# Patient Record
Sex: Female | Born: 1945 | Hispanic: No
Health system: Midwestern US, Academic
[De-identification: ages and names within clinical notes are randomized; demographics above are authoritative.]

---

## 2017-03-26 IMAGING — MG MAMMOGRAM, DIGITAL SCREEN BILA
1 series · 6 of 6 positions shown · non-contrast
Comparison: none

[Series 2: R CC · right · 6 of 6 slices shown]
[im 1/6]
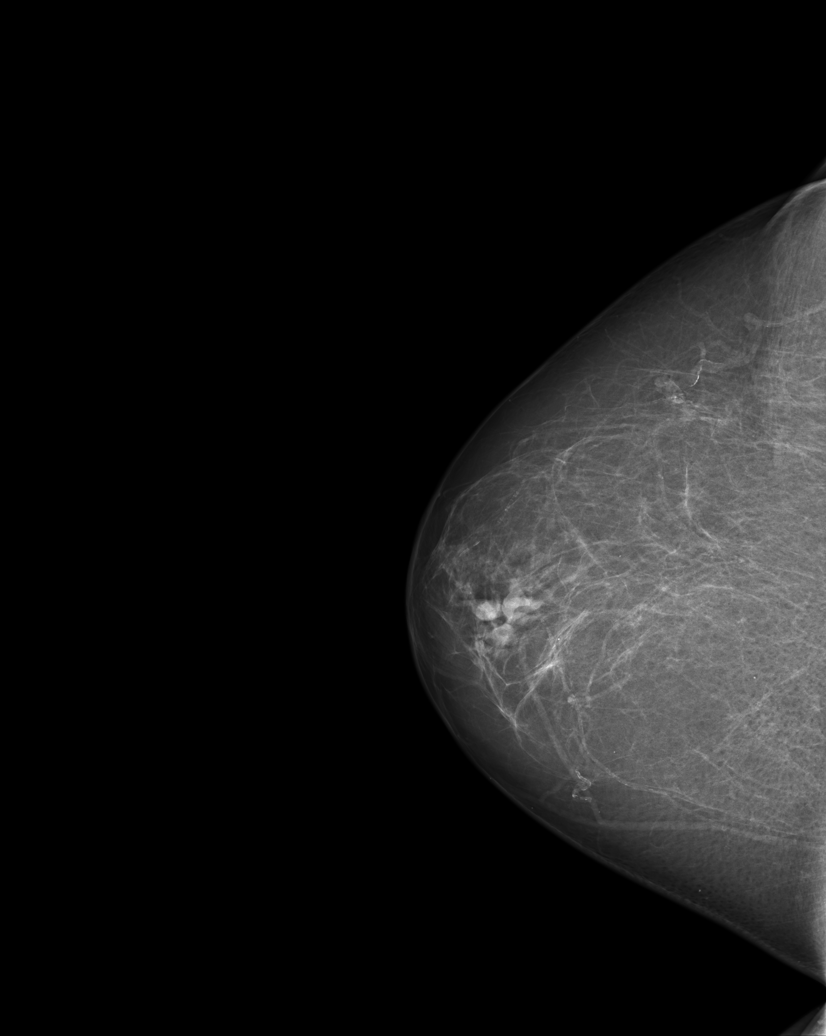
[im 2/6]
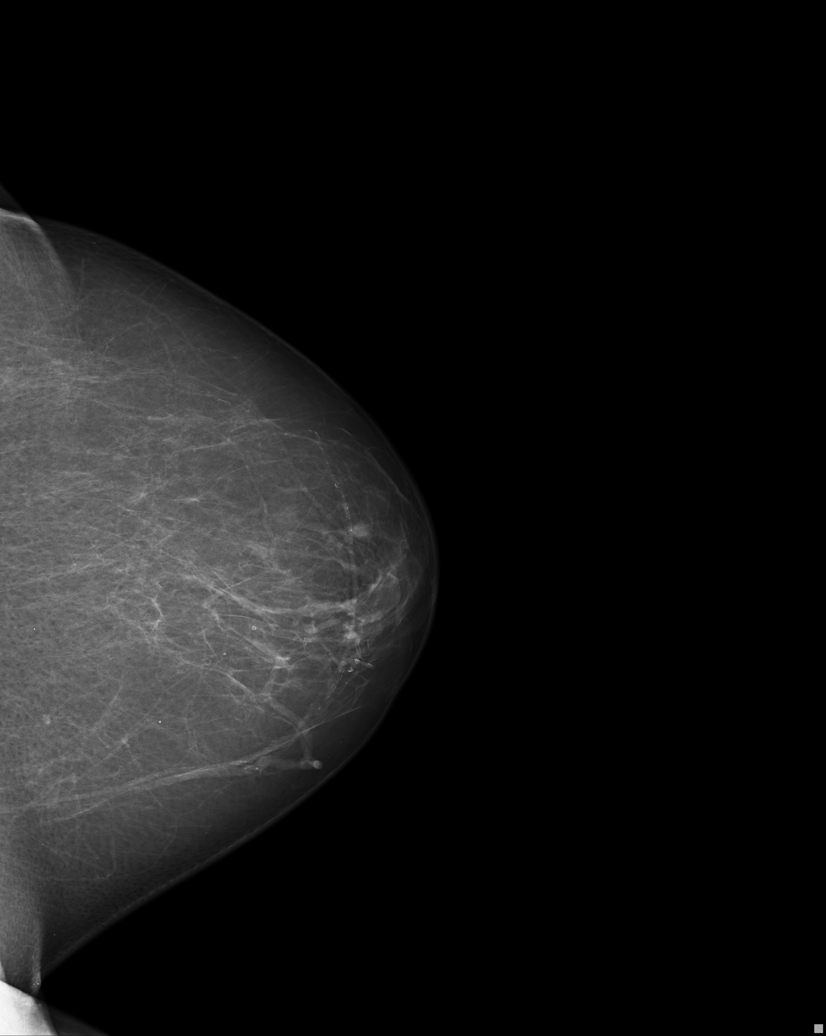
[im 3/6]
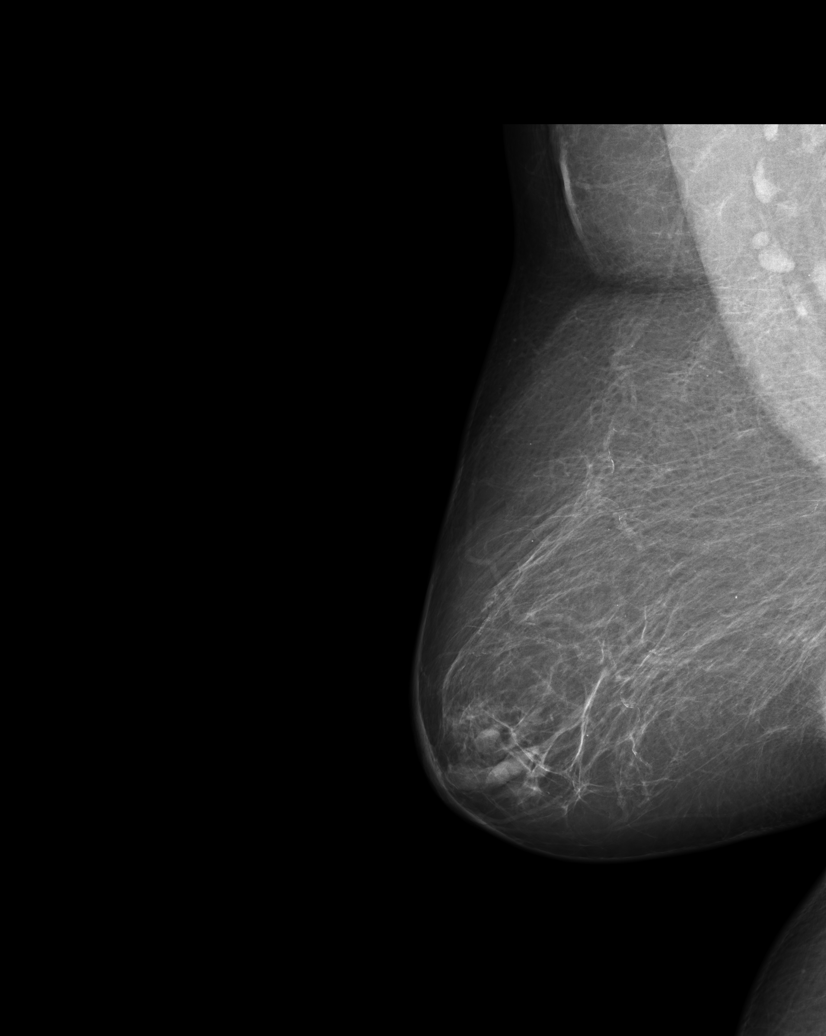
[im 4/6]
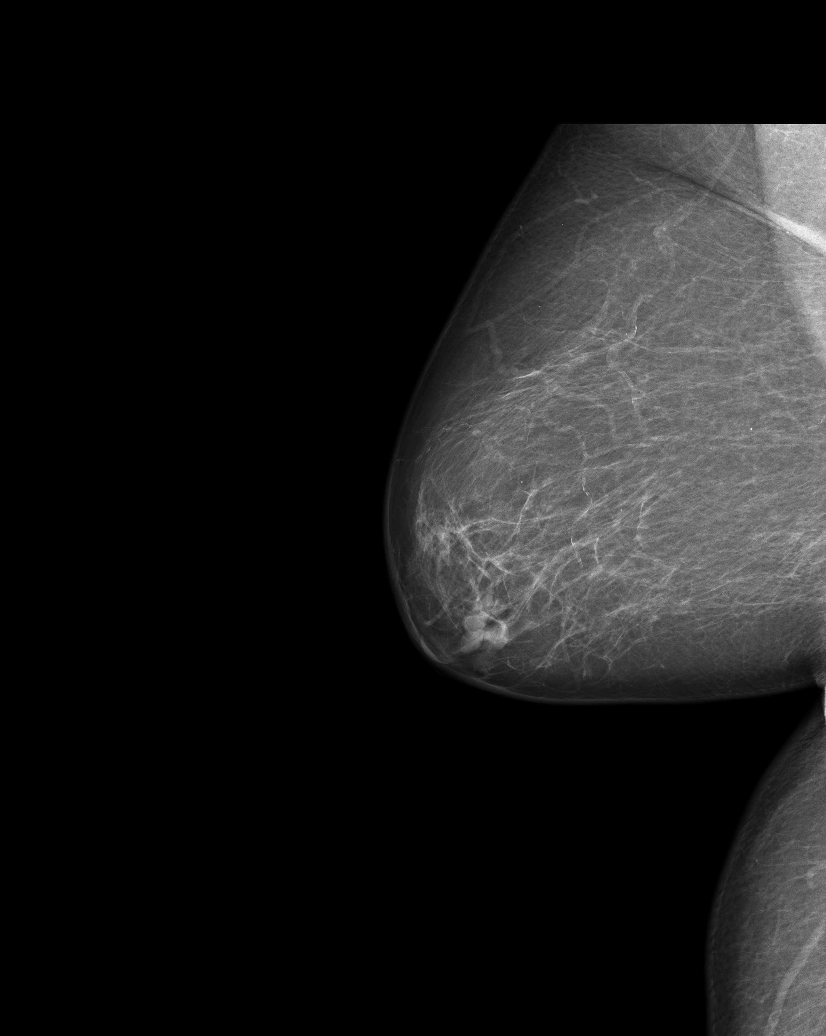
[im 5/6]
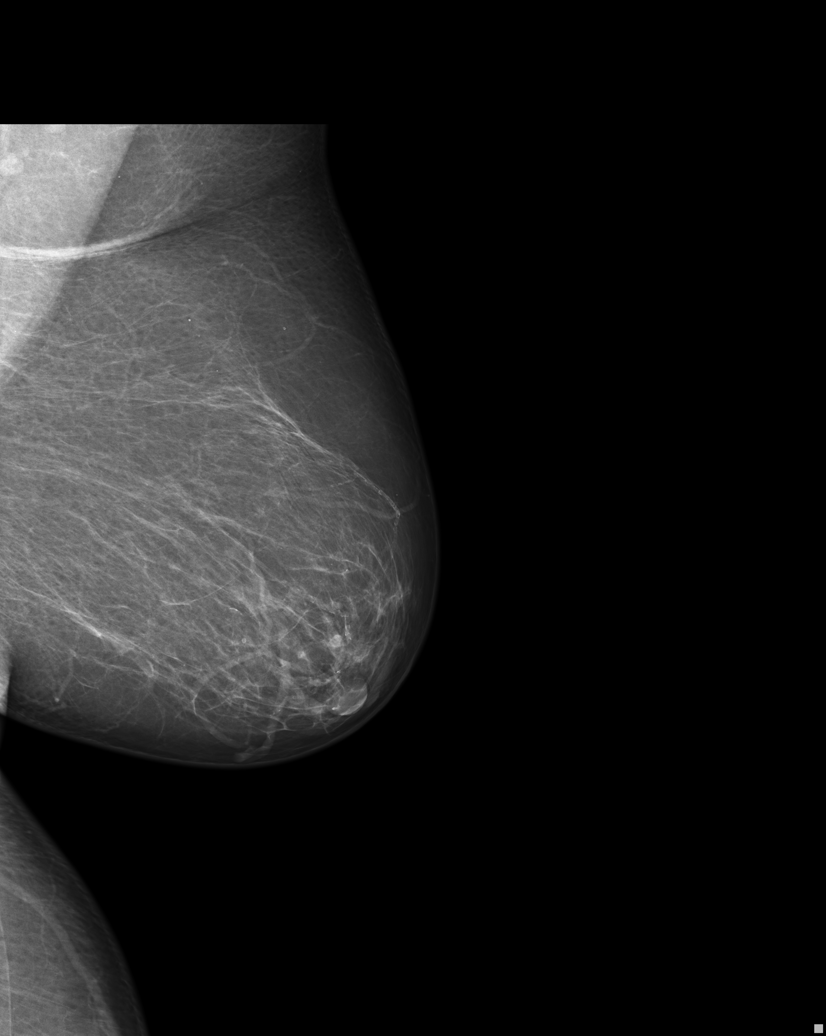
[im 6/6]
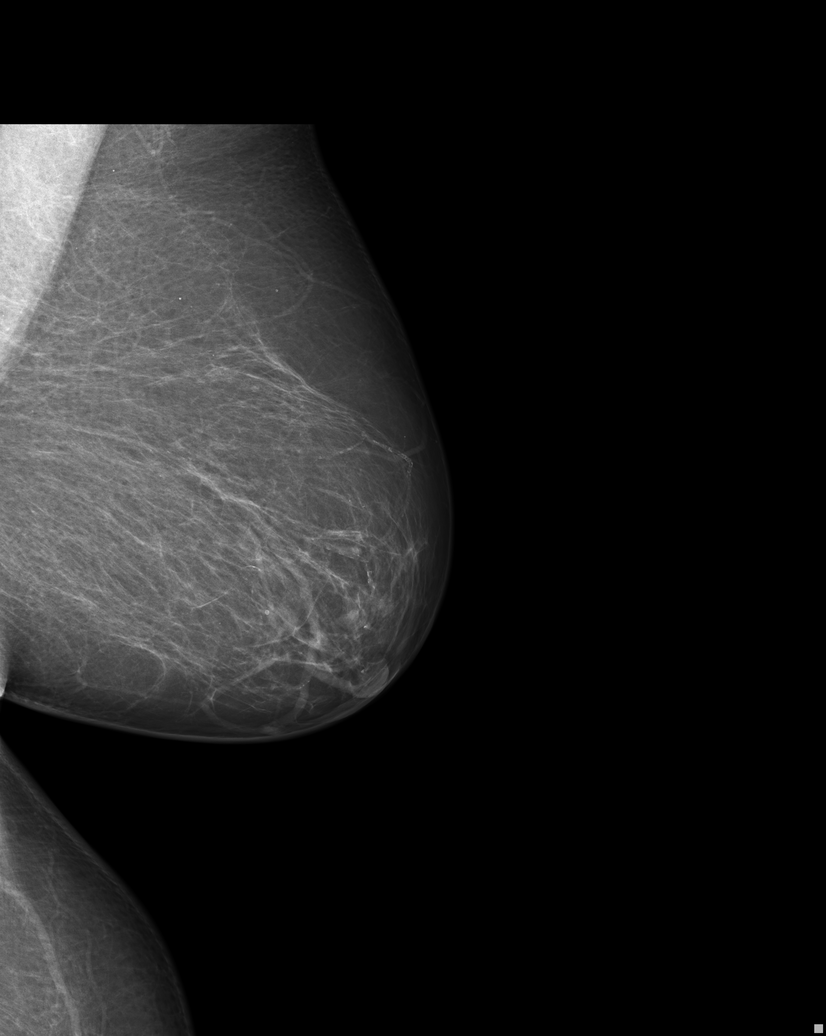

[6 of 6 positions shown; findings below may reference images not displayed]

DIAGNOSTIC STUDIES

EXAM

BILATERAL DIGITAL SCREENING MAMMOGRAM, [id number]; WITH CAD

INDICATION

screening
SKIN CA - NOSE 5147. SCREENING. AB (16-BJ CHOSE) PRIOR: 8426, 0015.

TECHNIQUE

Bilateral digital mammography has been performed in the craniocaudal and medial lateral oblique
projections.

Computed aided detection has been utilized in the interpretation of these images.

COMPARISONS

Comparison is made to previous studies.

FINDINGS

There are scattered fibroglandular densities. No new dominant mass or suspicious calcification is
identified.

The false negative rate of mammography is approximately 10%. Management of a palpable abnormality
must be based on clinical exam.

IMPRESSION

BI-RADS 1; Negative.

A twelve month screening mammogram is recommended and a follow-up letter will be scheduled.

## 2018-03-18 ENCOUNTER — Encounter: Admit: 2018-03-18 | Discharge: 2018-03-18 | Payer: BC Managed Care – PPO

## 2018-03-18 ENCOUNTER — Ambulatory Visit: Admit: 2018-03-18 | Discharge: 2018-03-19 | Payer: BC Managed Care – PPO

## 2018-03-18 DIAGNOSIS — Z8249 Family history of ischemic heart disease and other diseases of the circulatory system: Principal | ICD-10-CM

## 2018-03-18 IMAGING — US ECHOCOMPL
1 series · 14 of 24 positions shown · non-contrast
Comparison: none

[Series 1: us echo 2d, wo/w m-mode, compl · 95 acquisitions, 14 frames shown]
[im 1/95]
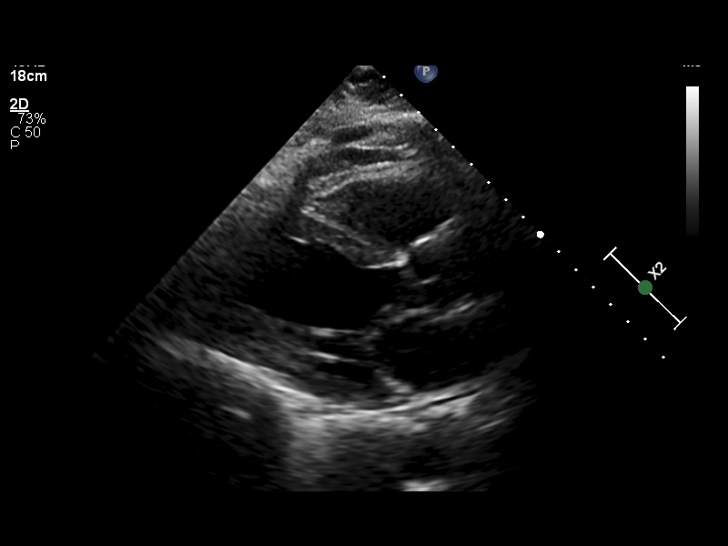
[im 9/95]
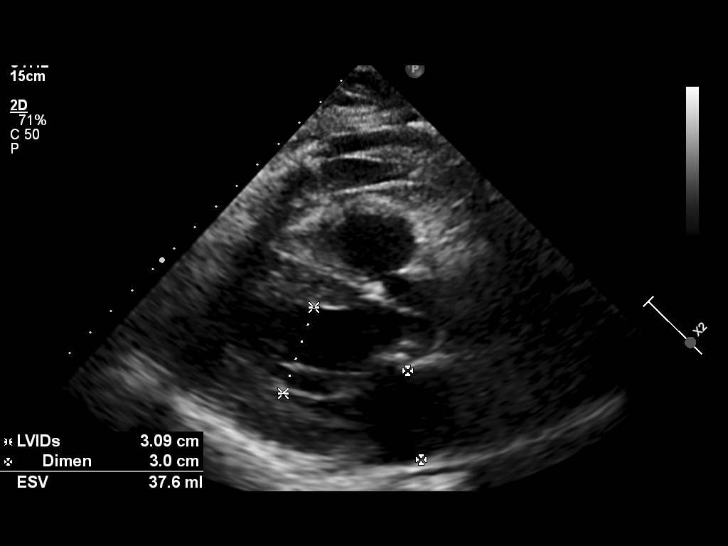
[im 17/95]
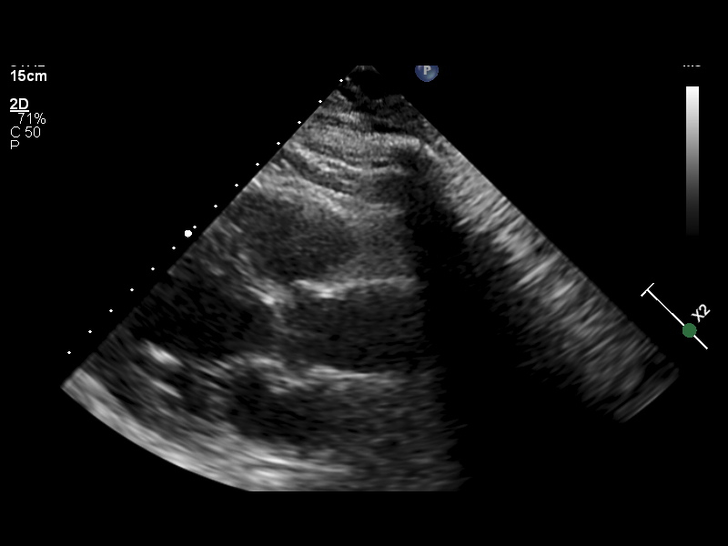
[im 25/95]
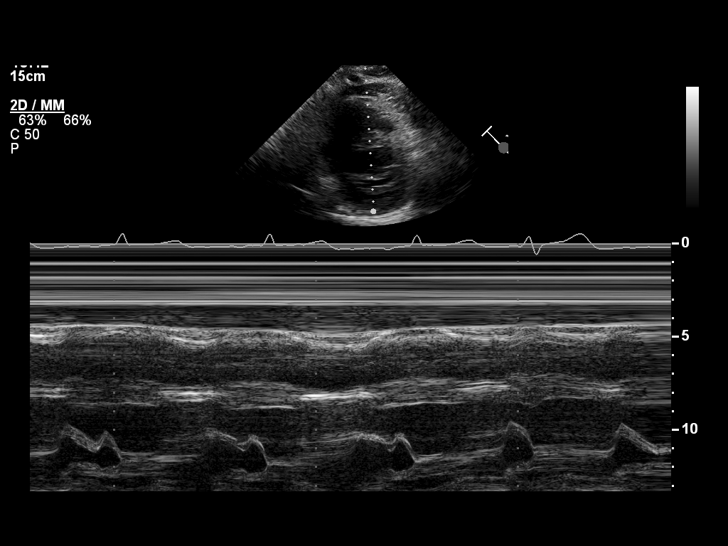
[im 29/95]
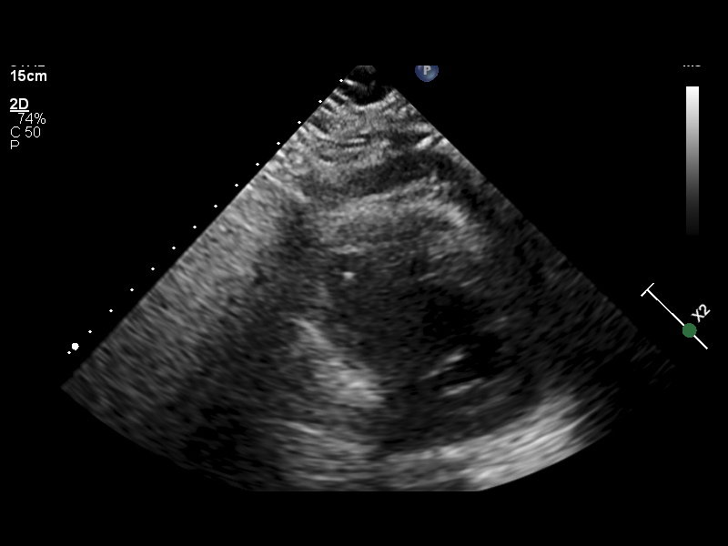
[im 37/95]
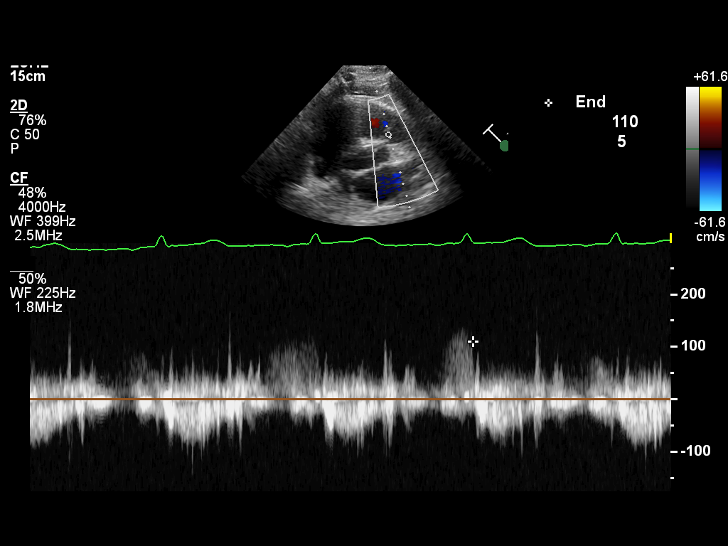
[im 45/95]
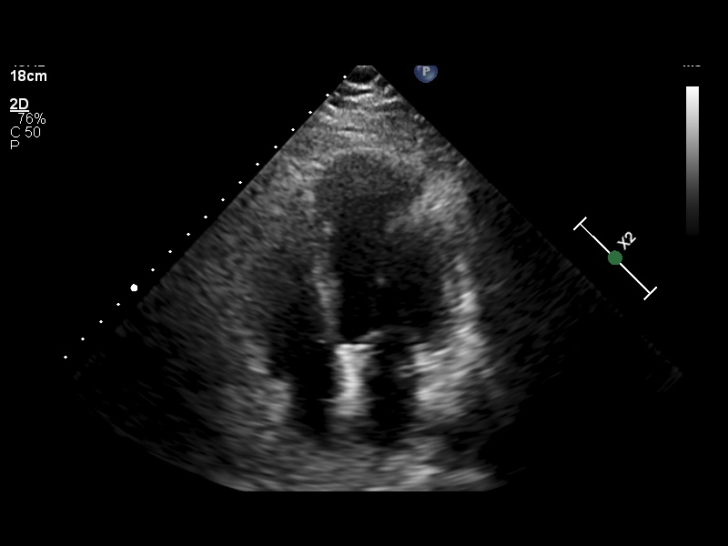
[im 45/95]
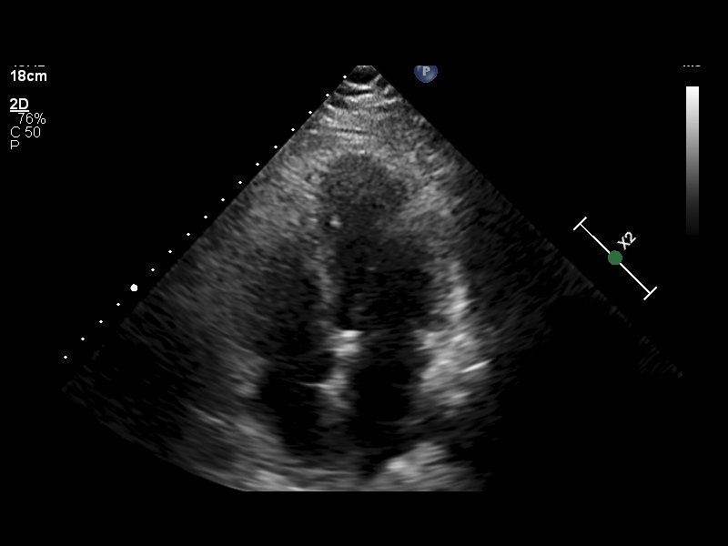
[im 54/95]
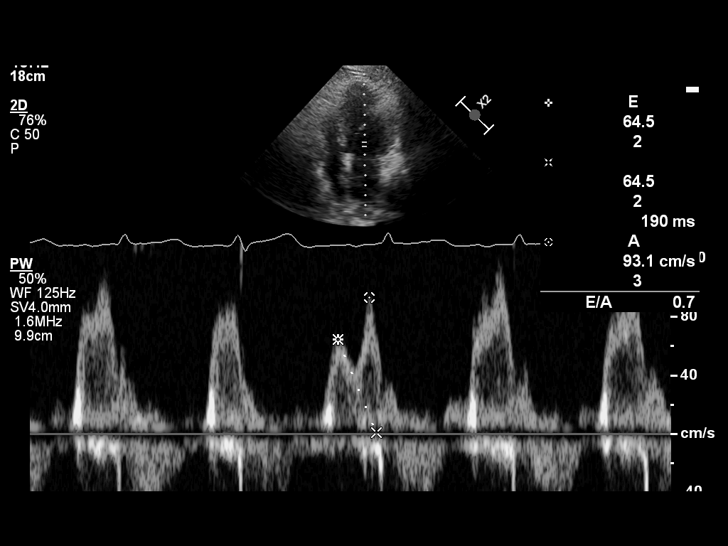
[im 62/95]
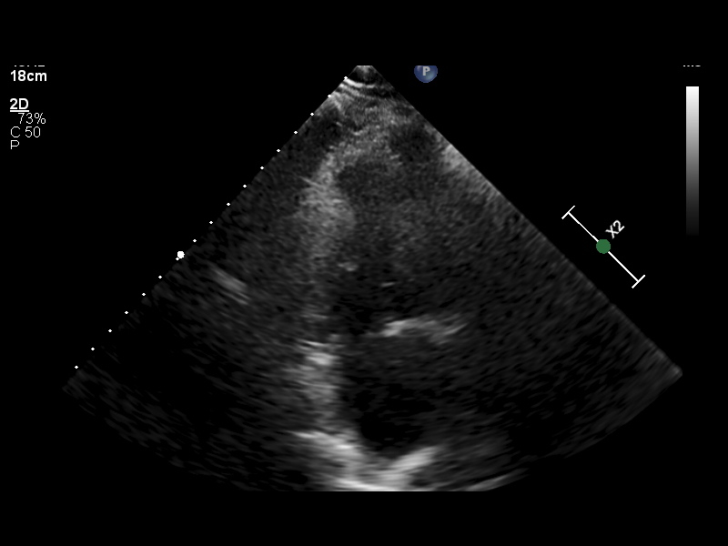
[im 70/95]
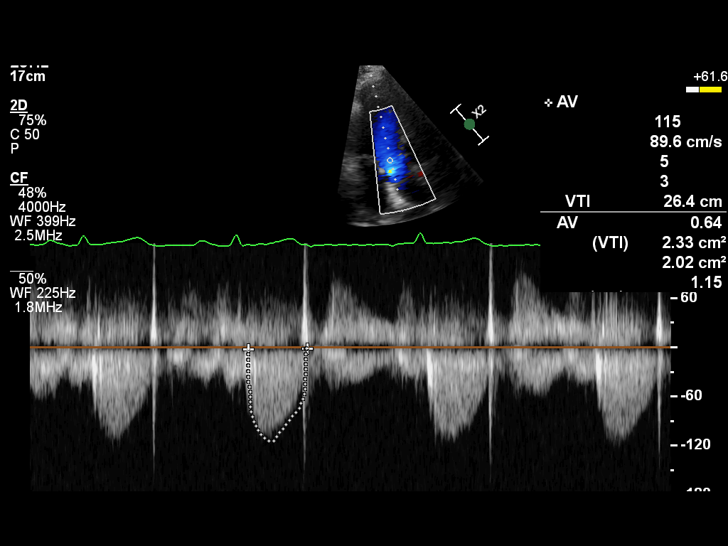
[im 78/95]
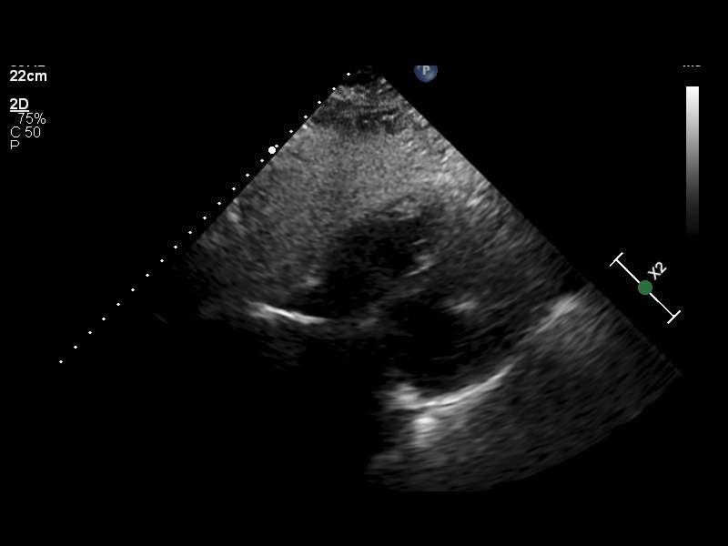
[im 86/95]
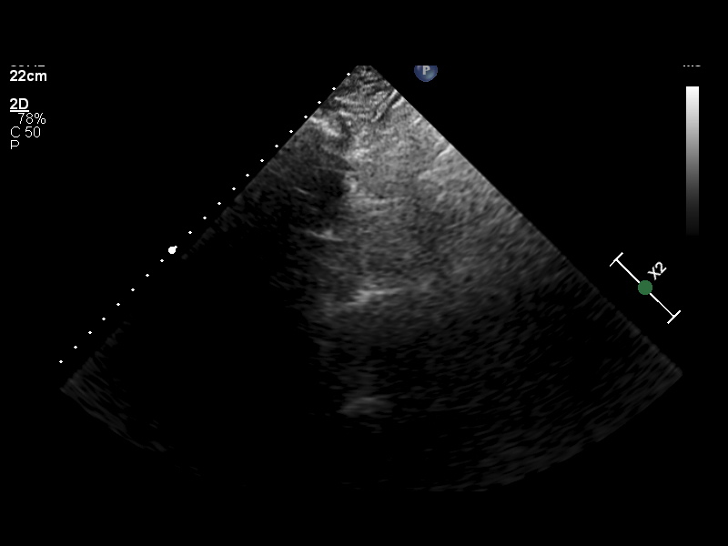
[im 95/95]
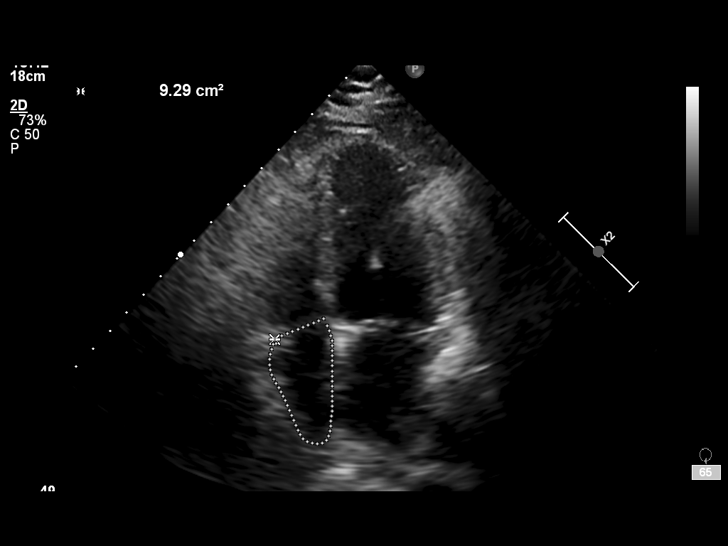

[14 of 24 positions shown; findings below may reference images not displayed]

FINAL REPORT IS SCANNED IN THE PATIENT'S EMR.

Tech Notes:

jl

## 2018-04-02 IMAGING — MG MAMMOGRAM 3D SCREEN, BILATERAL
11 of 17 series · 11 of 17 positions shown · non-contrast
Comparison: none

[R CC (1 of 2)]
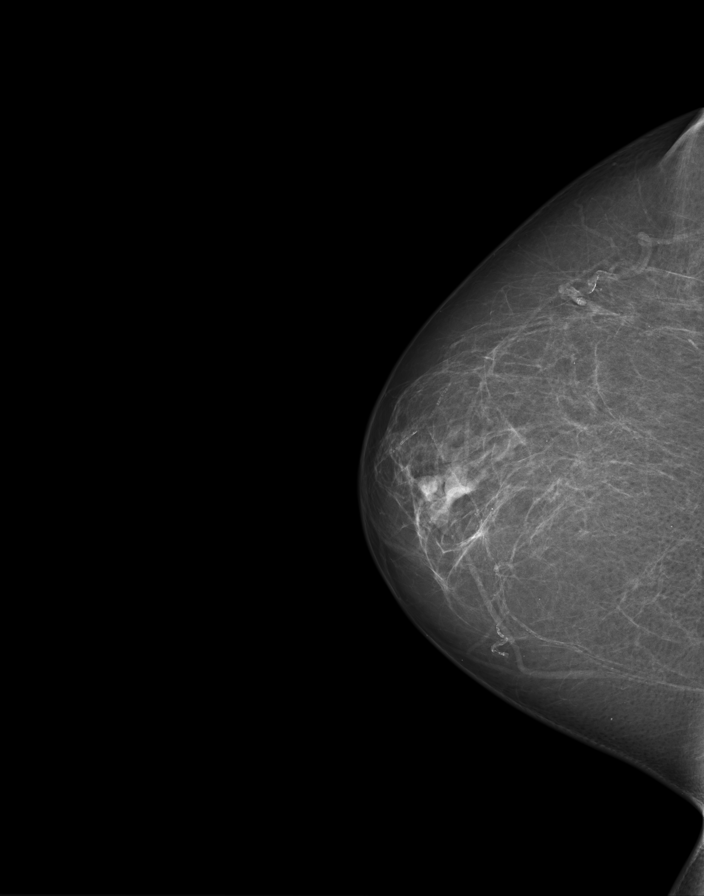

[R tomo (1 of 2)]
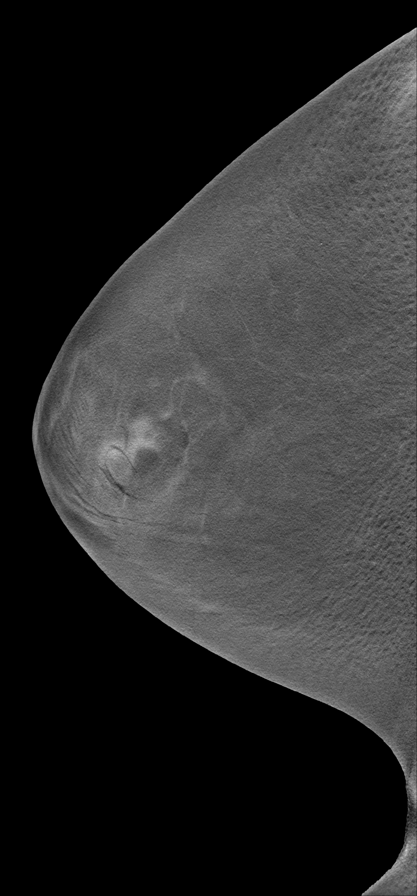

[R CC (2 of 2)]
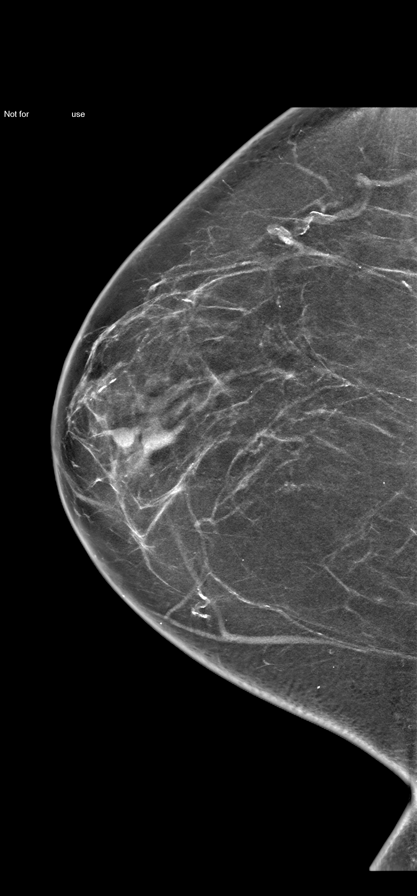

[R]
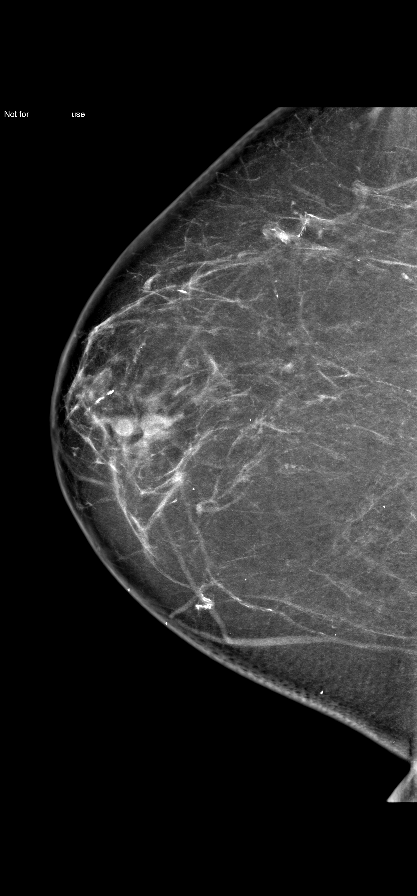

[L CC (1 of 2)]
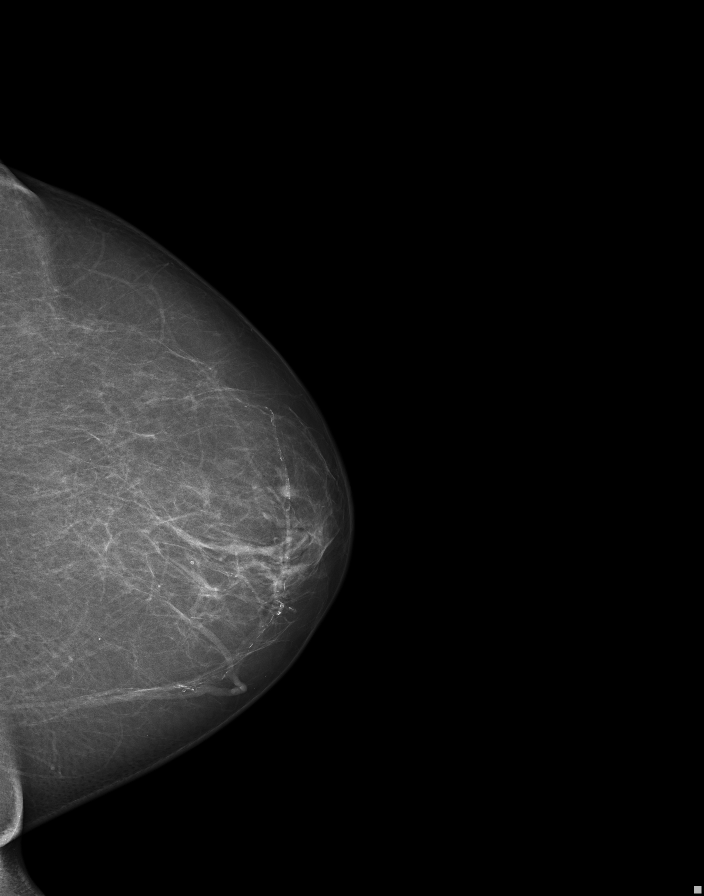

[L tomo]
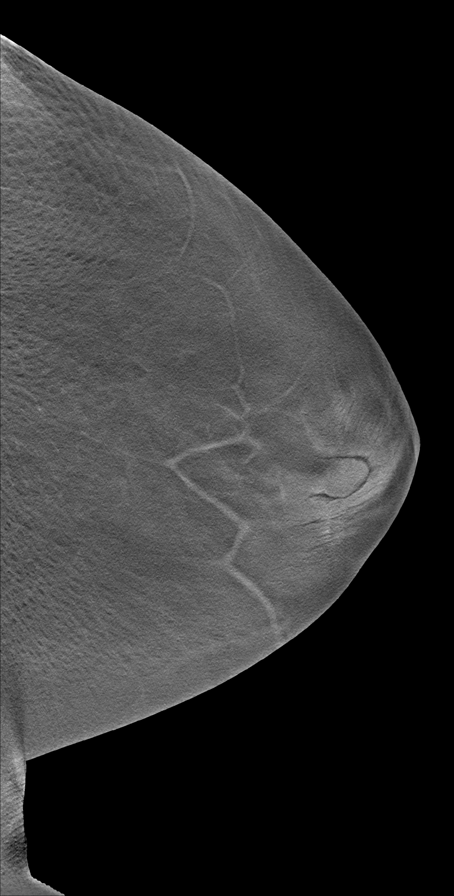

[L CC (2 of 2)]
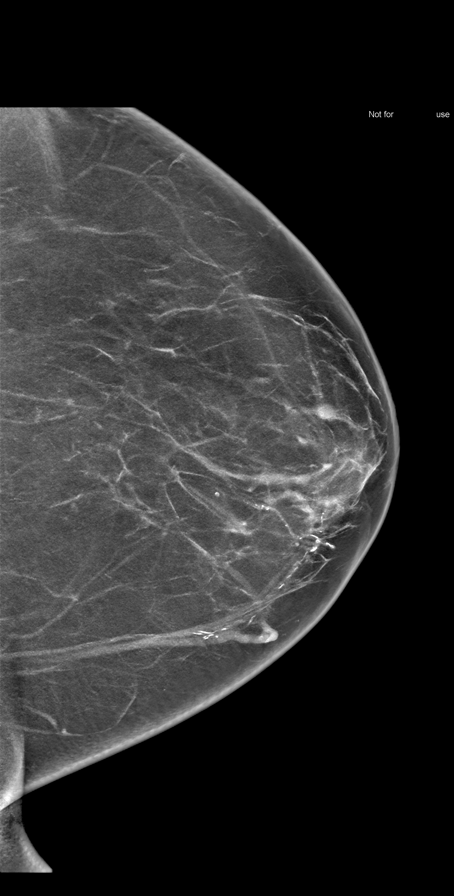

[L]
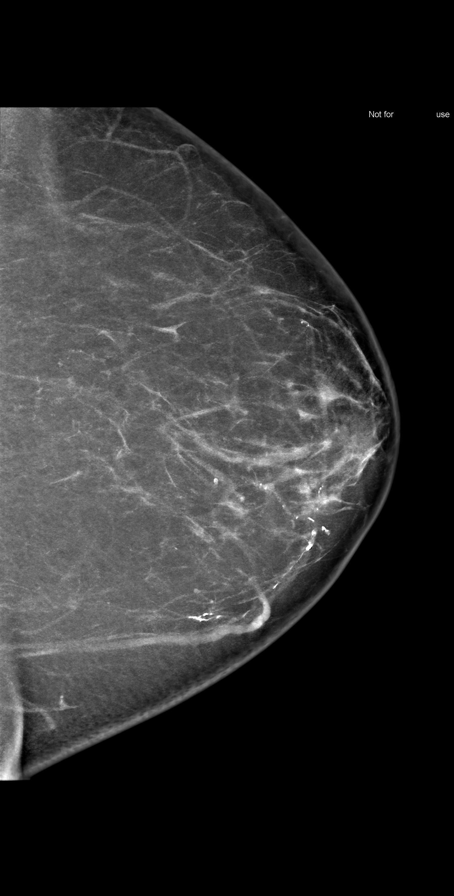

[R MLO (1 of 2)]
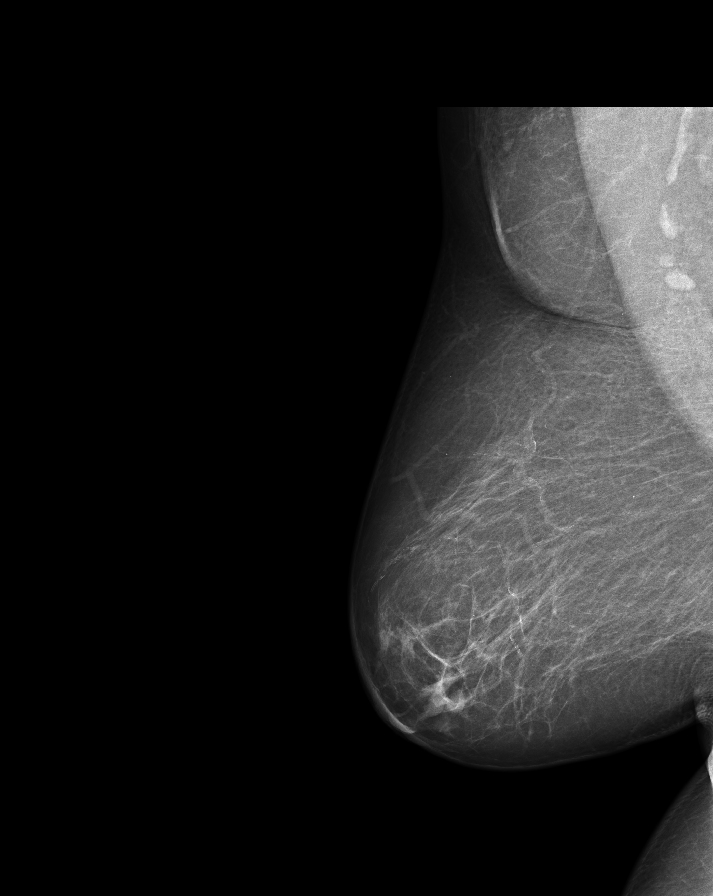

[R MLO (2 of 2)]
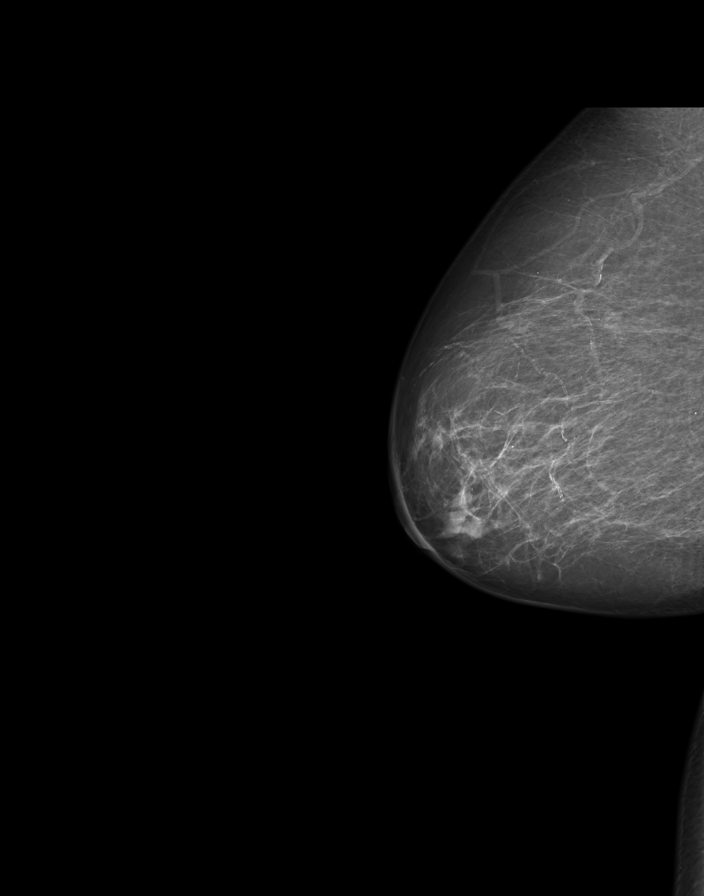

[R tomo (2 of 2)]
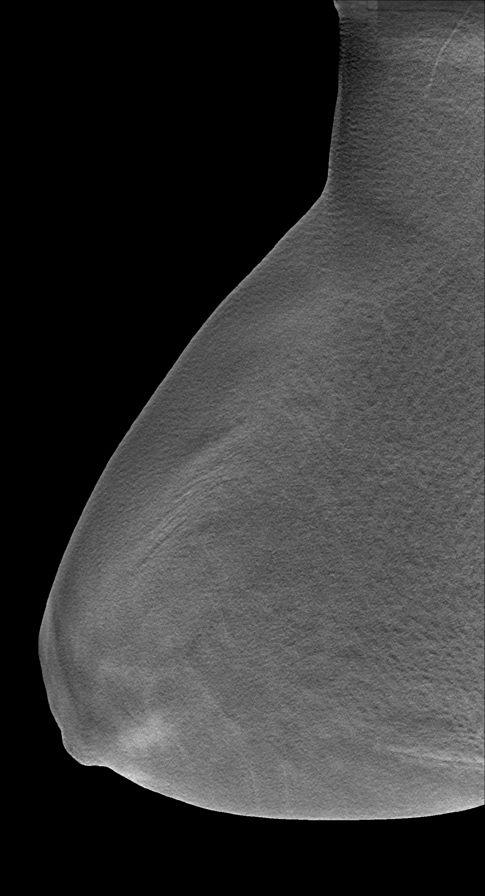

[11 of 17 positions shown; findings below may reference images not displayed]

DIAGNOSTIC STUDIES

EXAM

Screening mammogram.

INDICATION

screening
SKIN CA ON NOSE IN 6731.  SCREENING.  AB (3D) PRIORS: 6104, 6731.

TECHNIQUE

Tomographic CC and MLO acquisitions. Digitally acquired CC and MLO views. CAD utilized for
interpretation.

COMPARISONS

March 26, 2017;March 01, 2016;June 02, 2014

FINDINGS

Scattered fibroglandular density. Prominent ducts or vessels in the anterior retroareolar breast,
minimally changed. Benign microcalcifications. No compelling evidence of new mass, distortion, skin
thickening, nipple retraction, or suspicious calcification.

IMPRESSION

Benign, BI-RADS category 2.

RECOMMENDATION

Annual screening mammography. A notification letter will be sent to the patient regarding findings
and recommendations.

Tech Notes:

## 2018-05-08 ENCOUNTER — Encounter: Admit: 2018-05-08 | Discharge: 2018-05-08 | Payer: BC Managed Care – PPO

## 2018-05-08 DIAGNOSIS — I10 Essential (primary) hypertension: Principal | ICD-10-CM

## 2018-05-28 ENCOUNTER — Encounter: Admit: 2018-05-28 | Discharge: 2018-05-28 | Payer: BC Managed Care – PPO

## 2018-05-28 ENCOUNTER — Ambulatory Visit: Admit: 2018-05-28 | Discharge: 2018-05-29 | Payer: BC Managed Care – PPO

## 2018-05-28 DIAGNOSIS — I420 Dilated cardiomyopathy: Principal | ICD-10-CM

## 2018-06-24 IMAGING — CT ABDOMEN_PELVIS W(Adult)
2 of 3 series · 11 of 46 positions shown, 12 images · IV contrast (Omnipaque)
Comparison: none

[Series 2: abdomen ax 3.00 br40 s3 · axial · 0.70mm/px · z∈[+1462,+1849]mm · 8 of 149 slices shown, 9 images]
[im 10/149  soft-tissue]
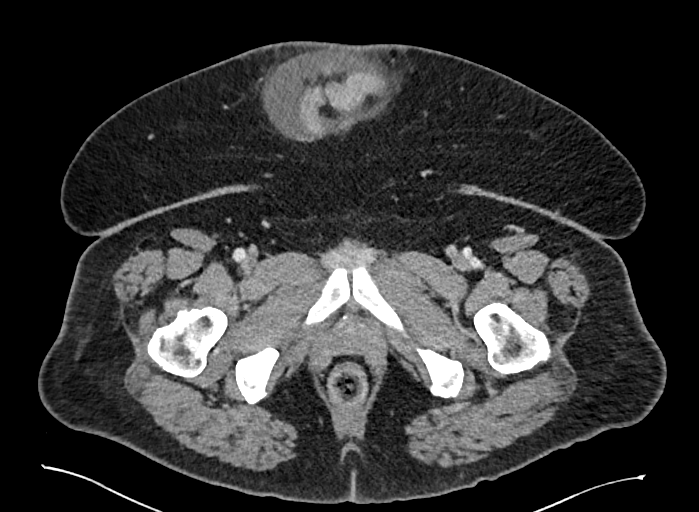
[im 10/149  bone]
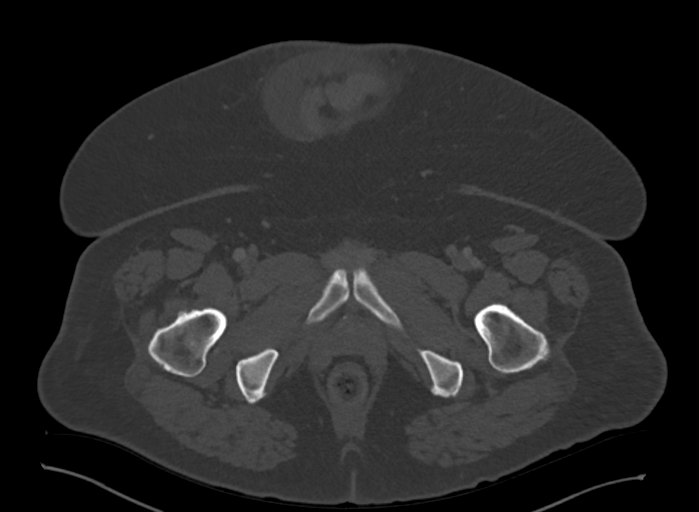
[im 29/149  soft-tissue]
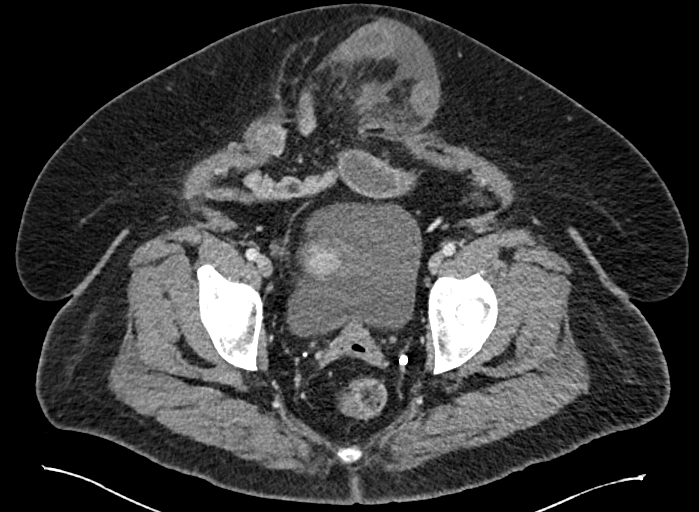
[im 48/149  soft-tissue]
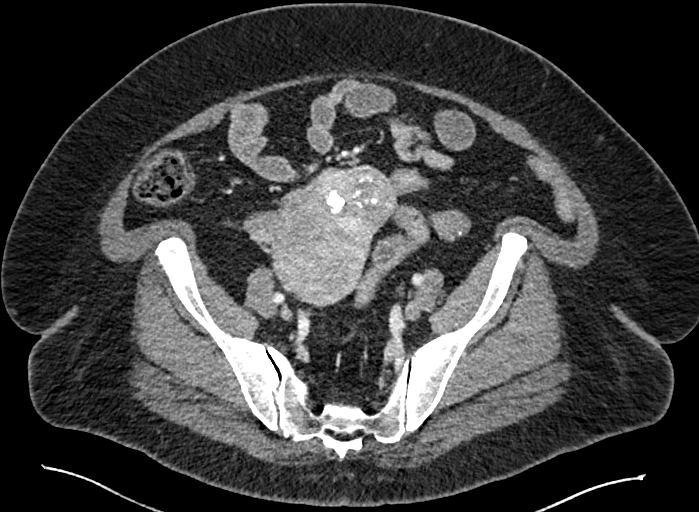
[im 67/149  soft-tissue]
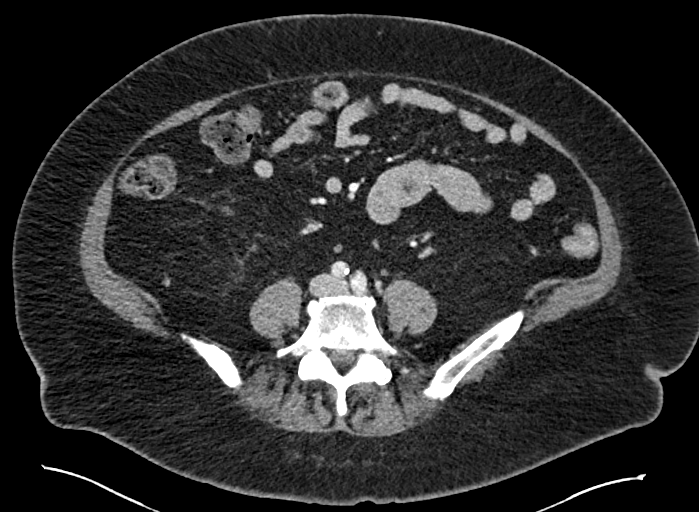
[im 82/149  soft-tissue]
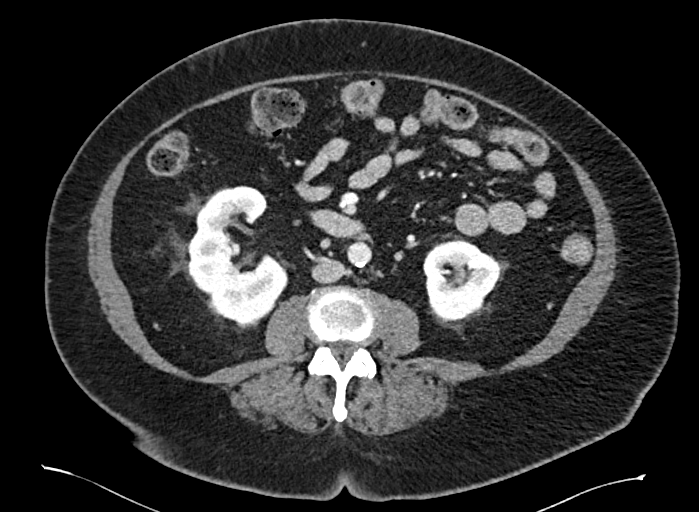
[im 101/149  soft-tissue]
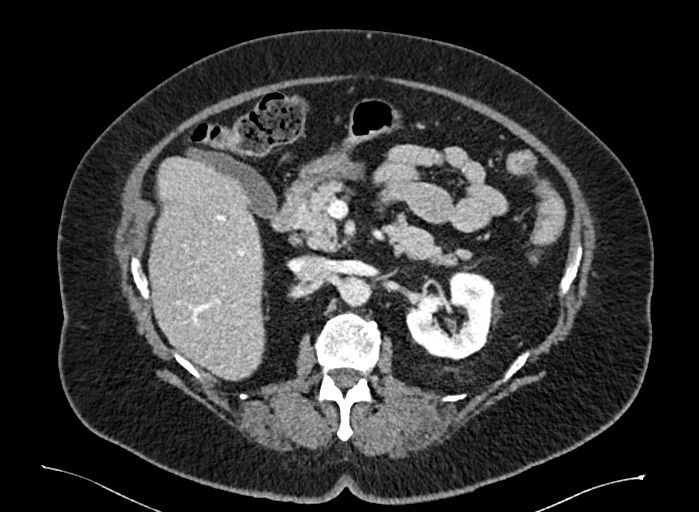
[im 120/149  soft-tissue]
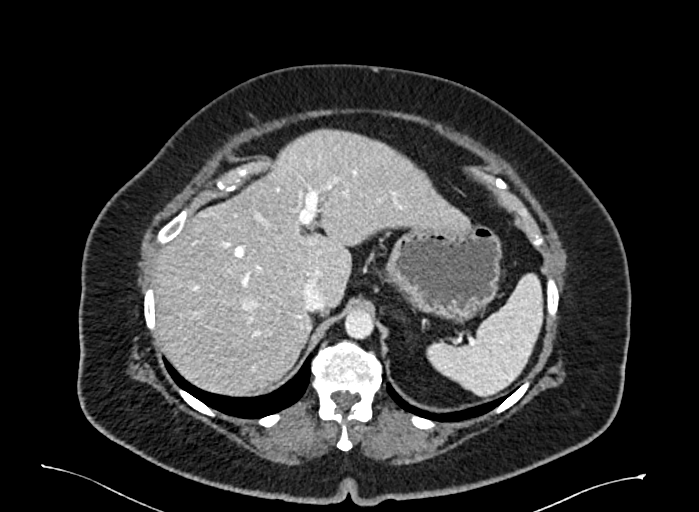
[im 139/149  soft-tissue]
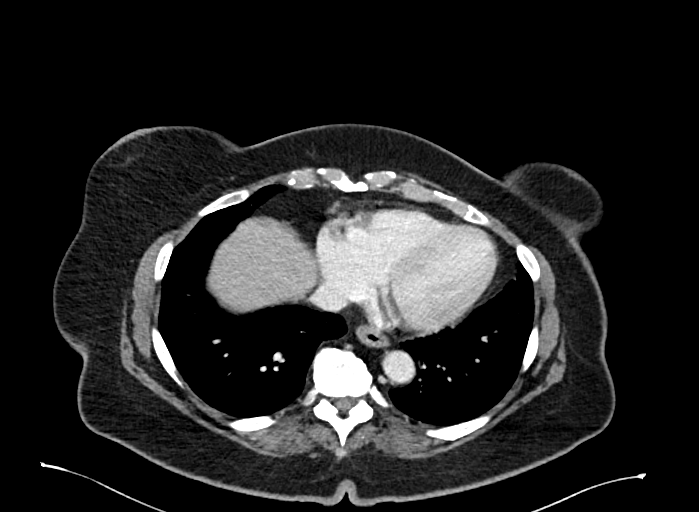

[Series 4: abdomen cor 3.00 br40 s3 · coronal · 0.88mm/px · 3 of 118 slices shown]
[im 40/118  soft-tissue]
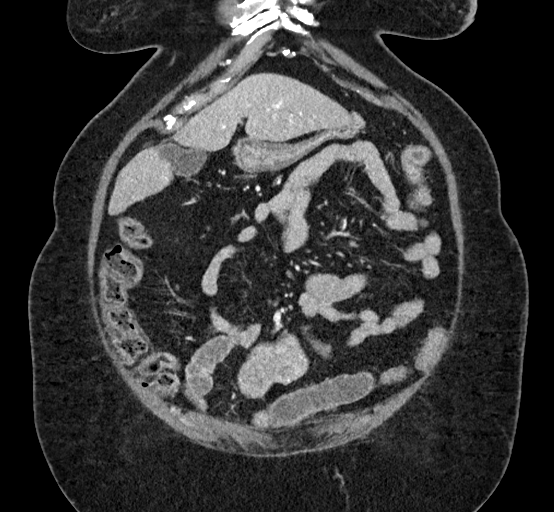
[im 53/118  soft-tissue]
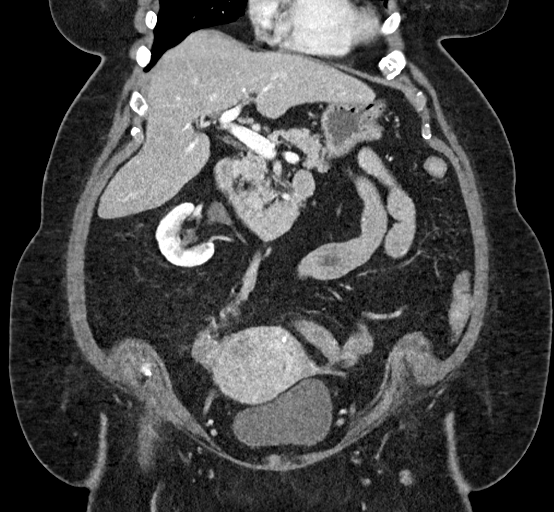
[im 66/118  soft-tissue]
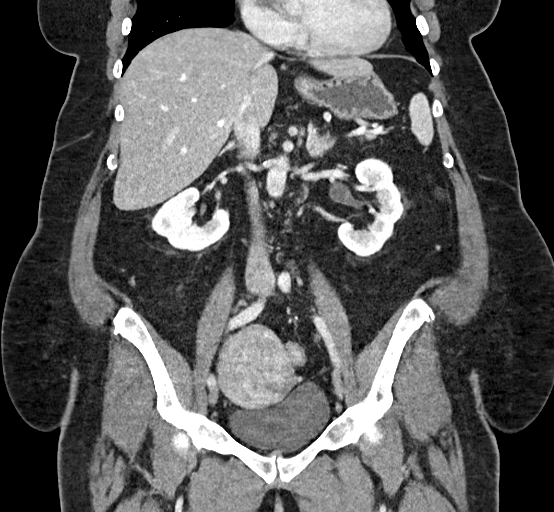

[11 of 46 positions shown; findings below may reference images not displayed]

------------- REPORT [id number] -------------
EXAM

CT abd/pel w iv con

INDICATION

Upper abdominal pain and vomiting

TECHNIQUE

CT of the abdomen and pelvis was performed. All CT scans at this facility use dose modulation,
iterative reconstruction, and/or weight based dosing when appropriate to reduce radiation dose to as
low as reasonably achieved.

COMPARISONS

10/05/2019

FINDINGS

Lung bases: No pleural effusion or suspicious pulmonary nodule in the lung bases. Normal cardiac
size without a pericardial effusion.

Liver: There is normal hepatic size without a suspicious focal lesion.

Gallbladder and Biliary Tree: No CT evidence of cholelithiasis.  There is no intrahepatic or
extrahepatic biliary dilation.

Spleen :There is a small subjacent splenule

Pancreas: Unremarkable

Adrenal Glands: Unremarkable

Kidneys: There is symmetric contrast enhancement without evidence of a suspicious focal lesion.
There are incompletely characterized small cystic structures within the kidneys. There is no
hydroureteronephrosis or renal stone.

Bladder: Unremarkable for the degree of distention.

Pelvic Organs: [Similar appearance of the extensive large subserosal and intramural fibroids of the
uterus with coarse calcification at the anterior aspect. Normal appearance of the right ovary. The
left ovary may be flipped anteriorly to the uterus.]

Bowel: There may be a small sliding hiatal hernia. Normal appearance of the stomach. No evidence of
bowel obstruction.    There is no free air.

Ascites: Absent

Lymphadenopathy: No pathologically enlarged or morphologically abnormal lymph nodes by CT
appearance.

Vasculature: There is normal opacification of the visualized abdominal/pelvic vasculature without
evidence of stenosis or aneurysmal dilation.

Abdominal Wall and Mesentery: [Interval fluid within the large ventral abdominal hernia containing
distal small bowel loops. A short-segment of the small bowel loops appears to be circumferentially
thickened with subjacent fluid and there is proximal slight small-bowel dilatation suggestive of
ileus.] There is associated vascular mesenteric congestion along the short-segment of the small
bowel loop

Musculoskeletal: There are degenerative changes of the visualized spine without evidence of an
aggressive osseous lesion or fracture.

IMPRESSION
1. Large ventral abdominal hernia containing distal small bowel loops with interval surrounding
inflammatory stranding/fluid and circumferential small bowel wall thickening with proximal slight
small bowel dilatation suggestive of ileus versus partial small bowel obstruction. No closed loop
obstruction.
2. Correlate clinically for incarceration and subsequent partial small-bowel obstruction versus
ileus.

Tech Notes:

Pt c/o upper abdomen pain and vomiting. Creat: 0.95.
GFR: 62. [id number] used. CT/NM [DATE].

------------- REPORT [id number] -------------
Consulting: Hashimoto, Lucinda; Eleanore, Iviandri; Kewley, Nuzhah; Teli, Shair

**ADDENDUM**
ADDENDUM

Number of previous computed tomography exams in the last 12 months is 2 .

Number of previous nuclear medicine myocardial perfusion studies in the last 12 months is 0 .

TD/TT: /

EXAM

CT abd/pel w iv con

INDICATION

Upper abdominal pain and vomiting

TECHNIQUE

CT of the abdomen and pelvis was performed. All CT scans at this facility use dose modulation,
iterative reconstruction, and/or weight based dosing when appropriate to reduce radiation dose to as
low as reasonably achieved.

COMPARISONS

10/05/2019

FINDINGS

Lung bases: No pleural effusion or suspicious pulmonary nodule in the lung bases. Normal cardiac
size without a pericardial effusion.

Liver: There is normal hepatic size without a suspicious focal lesion.

Gallbladder and Biliary Tree: No CT evidence of cholelithiasis.  There is no intrahepatic or
extrahepatic biliary dilation.

Spleen :There is a small subjacent splenule

Pancreas: Unremarkable

Adrenal Glands: Unremarkable

Kidneys: There is symmetric contrast enhancement without evidence of a suspicious focal lesion.
There are incompletely characterized small cystic structures within the kidneys. There is no
hydroureteronephrosis or renal stone.

Bladder: Unremarkable for the degree of distention.

Pelvic Organs: [Similar appearance of the extensive large subserosal and intramural fibroids of the
uterus with coarse calcification at the anterior aspect. Normal appearance of the right ovary. The
left ovary may be flipped anteriorly to the uterus.]

Bowel: There may be a small sliding hiatal hernia. Normal appearance of the stomach. No evidence of
bowel obstruction.    There is no free air.

Ascites: Absent

Lymphadenopathy: No pathologically enlarged or morphologically abnormal lymph nodes by CT
appearance.

Vasculature: There is normal opacification of the visualized abdominal/pelvic vasculature without
evidence of stenosis or aneurysmal dilation.

Abdominal Wall and Mesentery: [Interval fluid within the large ventral abdominal hernia containing
distal small bowel loops. A short-segment of the small bowel loops appears to be circumferentially
thickened with subjacent fluid and there is proximal slight small-bowel dilatation suggestive of
ileus.] There is associated vascular mesenteric congestion along the short-segment of the small
bowel loop

Musculoskeletal: There are degenerative changes of the visualized spine without evidence of an
aggressive osseous lesion or fracture.

IMPRESSION
1. Large ventral abdominal hernia containing distal small bowel loops with interval surrounding
inflammatory stranding/fluid and circumferential small bowel wall thickening with proximal slight
small bowel dilatation suggestive of ileus versus partial small bowel obstruction. No closed loop
obstruction.
2. Correlate clinically for incarceration and subsequent partial small-bowel obstruction versus
ileus.

Tech Notes:

Pt c/o upper abdomen pain and vomiting. Creat: 0.95.
GFR: 62. JXR2D L0STRIM used. CT/NM [DATE].

## 2019-02-18 ENCOUNTER — Encounter: Admit: 2019-02-18 | Discharge: 2019-02-18

## 2019-03-24 IMAGING — CR UP_EXM
3 series · 3 of 3 positions shown · non-contrast
Comparison: none

[wrist pa]
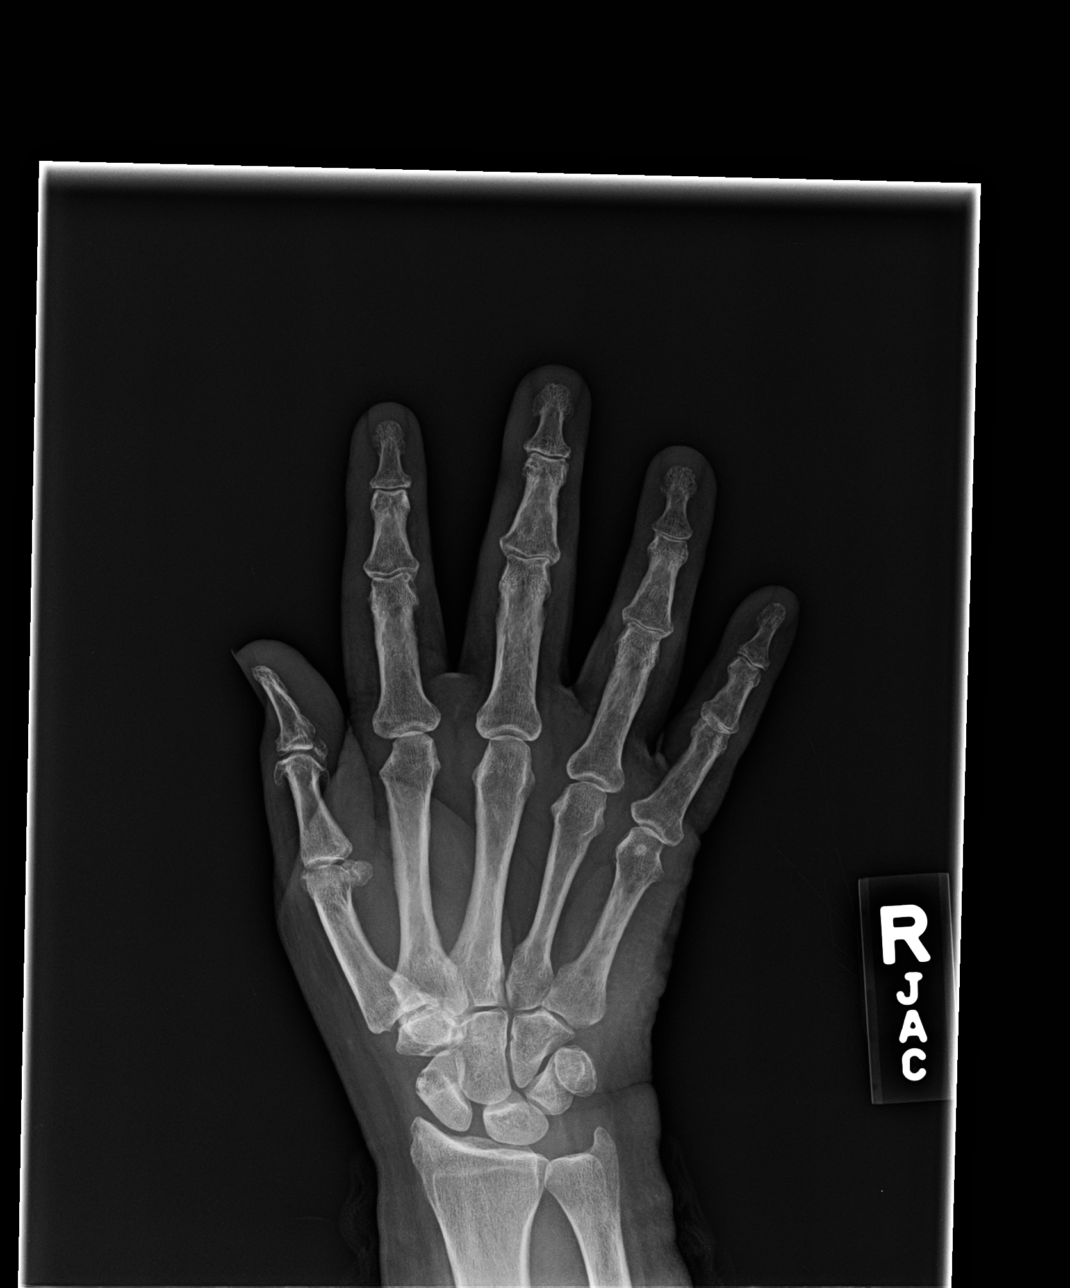

[wrist obl]
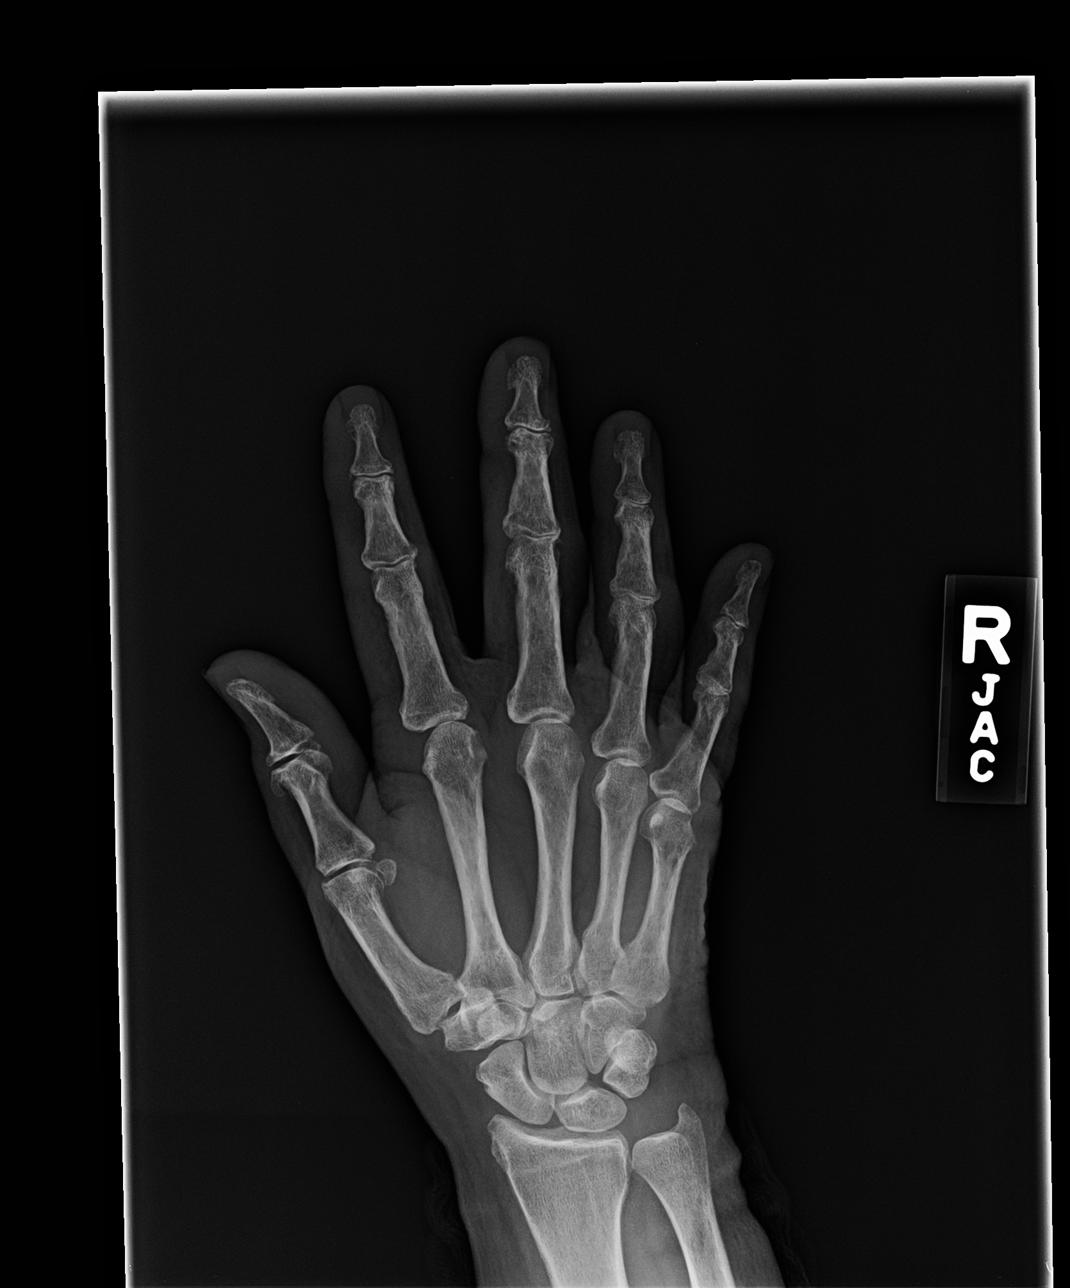

[wrist lat]
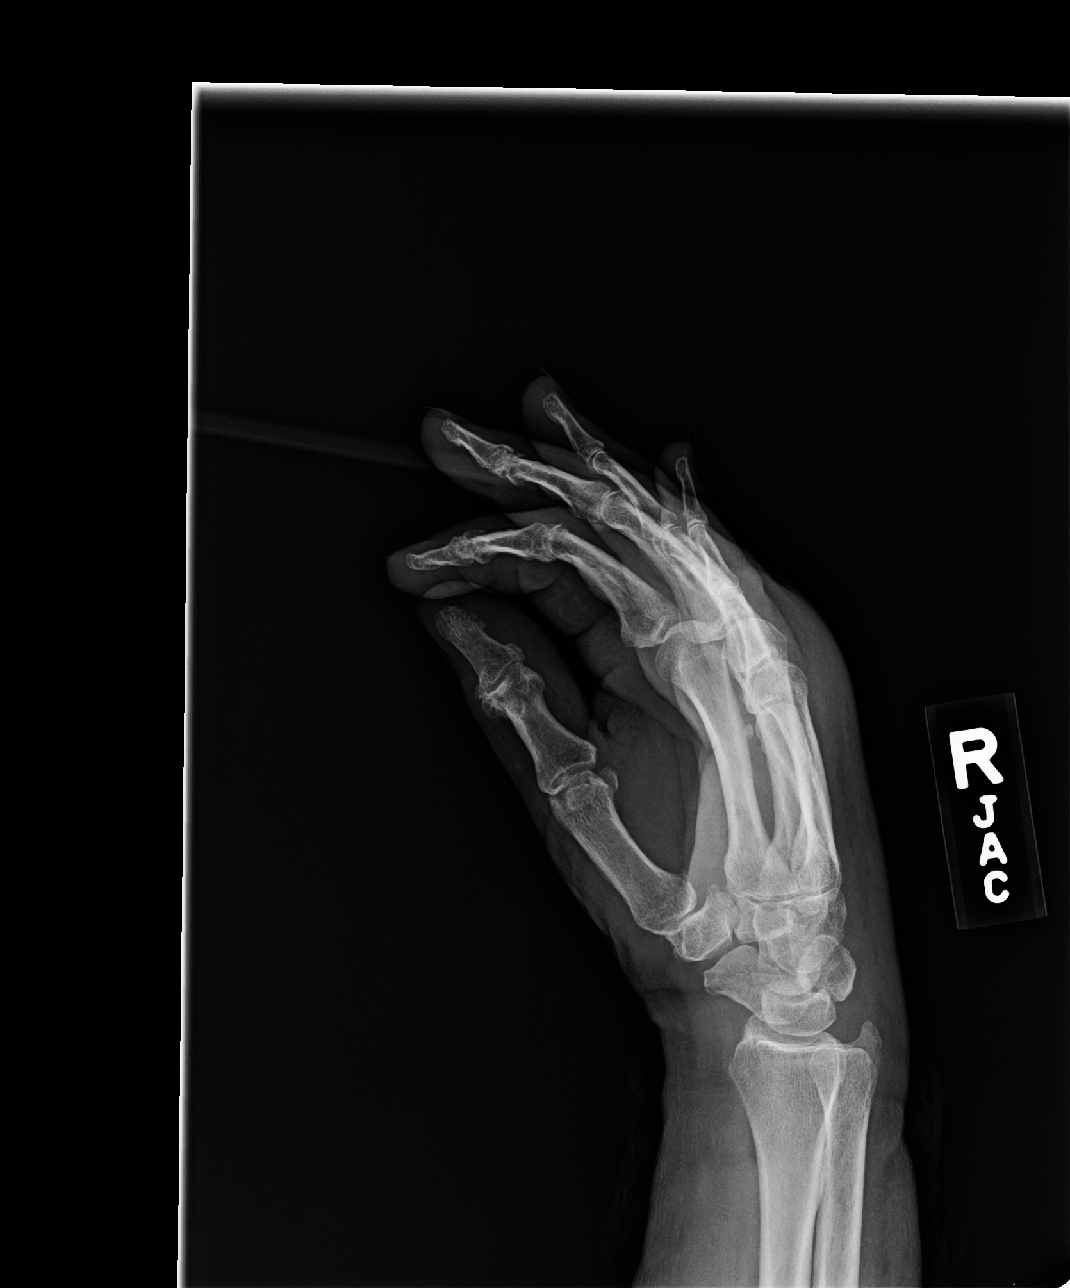

[3 of 3 positions shown; findings below may reference images not displayed]

EXAM

Right hand.

INDICATION

hand pain
Pt c/o pain in RT hand x 1 month. Pt states unable to move hand/no control. JC/HB

FINDINGS

Three views of the right hand were obtained.

There is evidence of a subacute appearing fracture of the distal portion of the scaphoid waist and
base of the distal pole of the scaphoid.  There appears to be some bony resorption along the margin
of the fracture.

There is narrowing of the carpometacarpal joint of the thumb.

There is widening of the scapholunate interspace.

There are osteophytes of the proximal and distal margin of the trapezium and of the base of the
metacarpal of the thumb.

There is narrowing of the interphalangeal joint space of the thumb with prominent osteophytes of
the middle and distal phalanges. There also osteophytes the metacarpal head of the thumb.

IMPRESSION

There is a subacute appearing fracture of the distal portion the scaphoid waist and base of the
distal pole of the right scaphoid with resorption of bone along the margins of the fracture line.

There is widening of the scapholunate interspace most likely due to scapholunate ligament tear.

There is osteoarthrosis of the thumb, including the interphalangeal joint, MCP joint and
carpometacarpal joint.

Tech Notes:

Pt c/o pain in RT hand x 1 month. Pt states unable to move hand/no control. JC/HB

## 2019-04-09 IMAGING — CR UP_EXM
4 series · 4 of 4 positions shown · non-contrast
Comparison: none

[elbow ap]
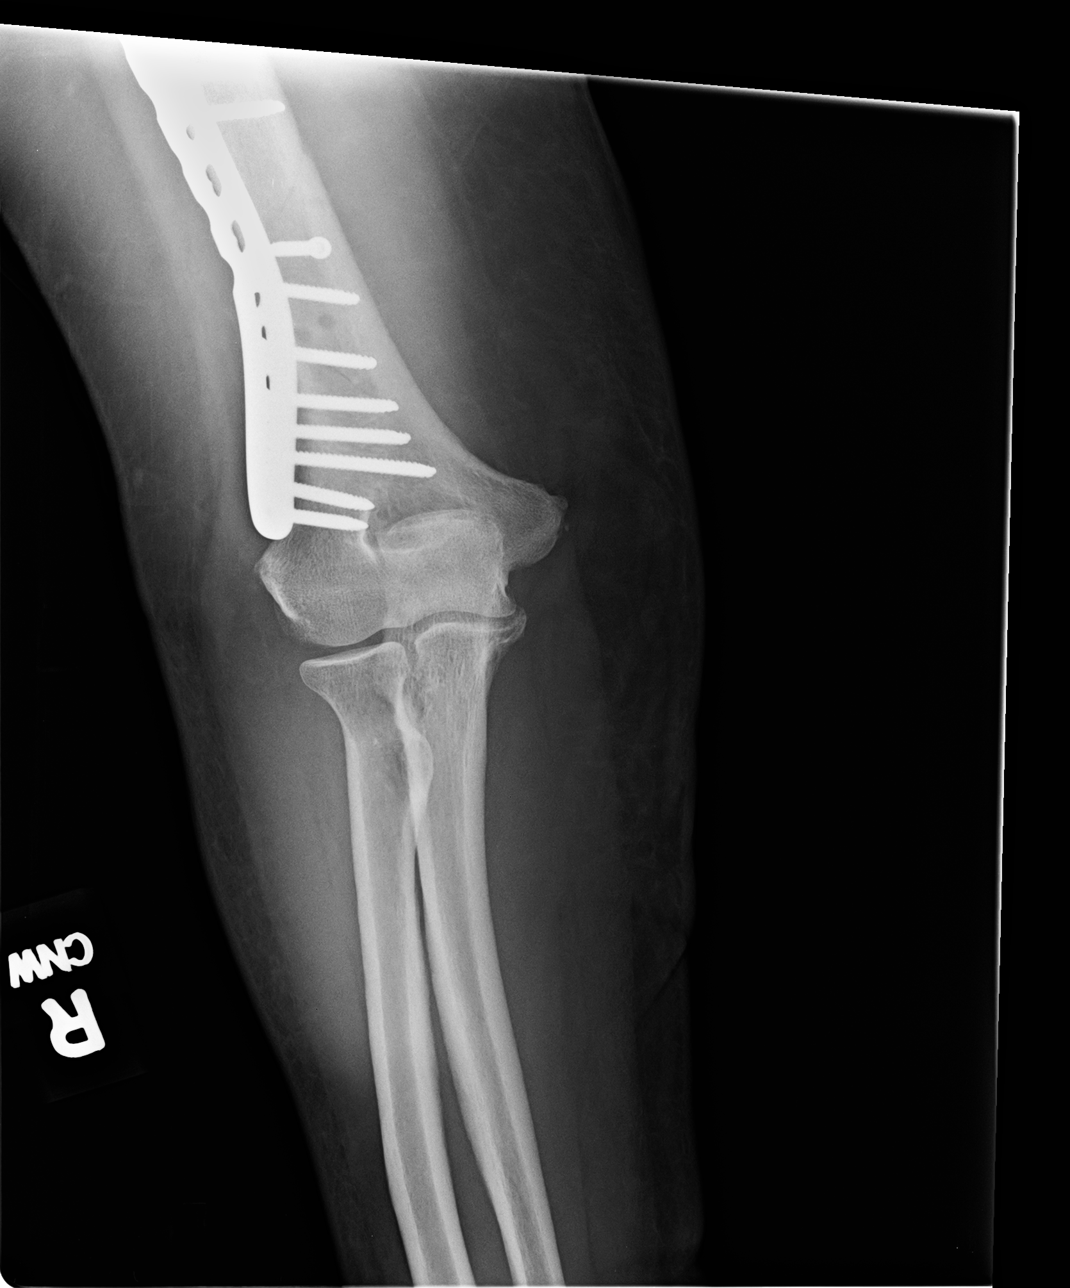

[elbow obl (1 of 2)]
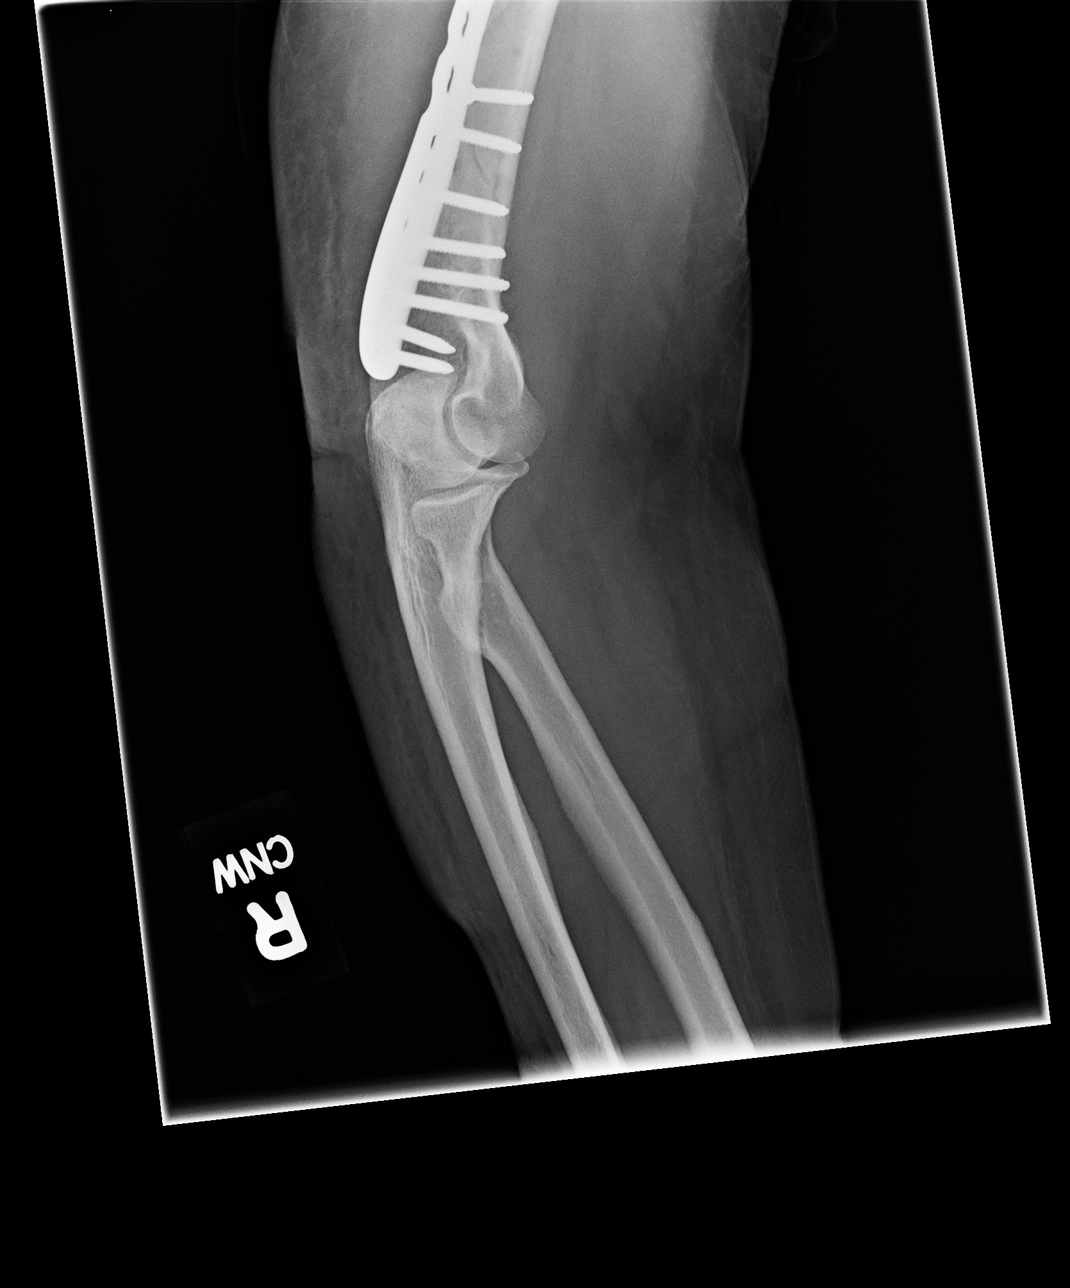

[elbow obl (2 of 2)]
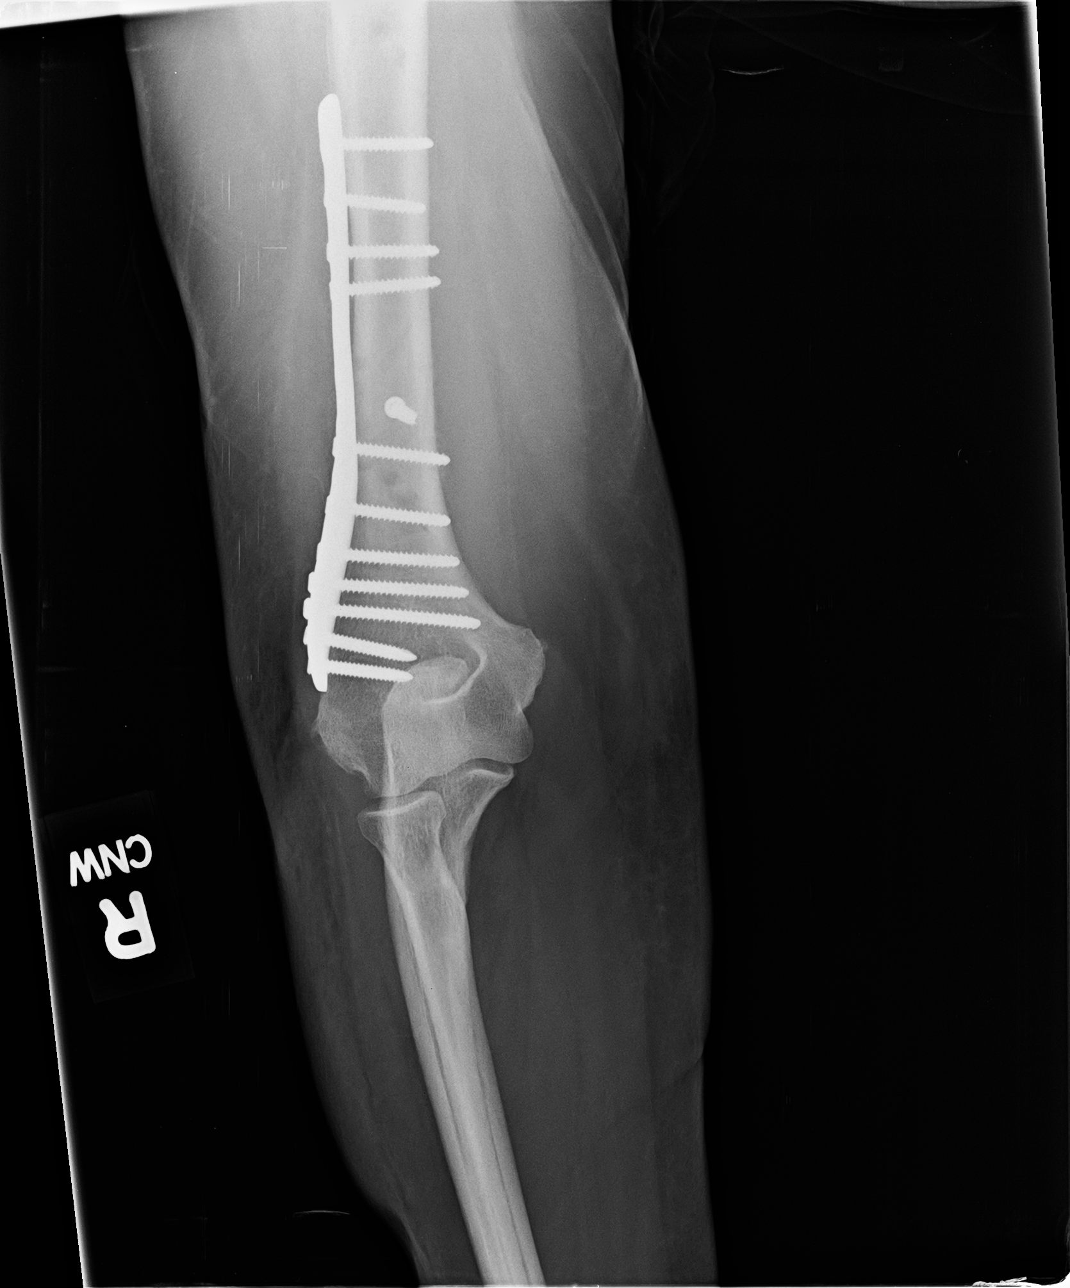

[elbow lat]
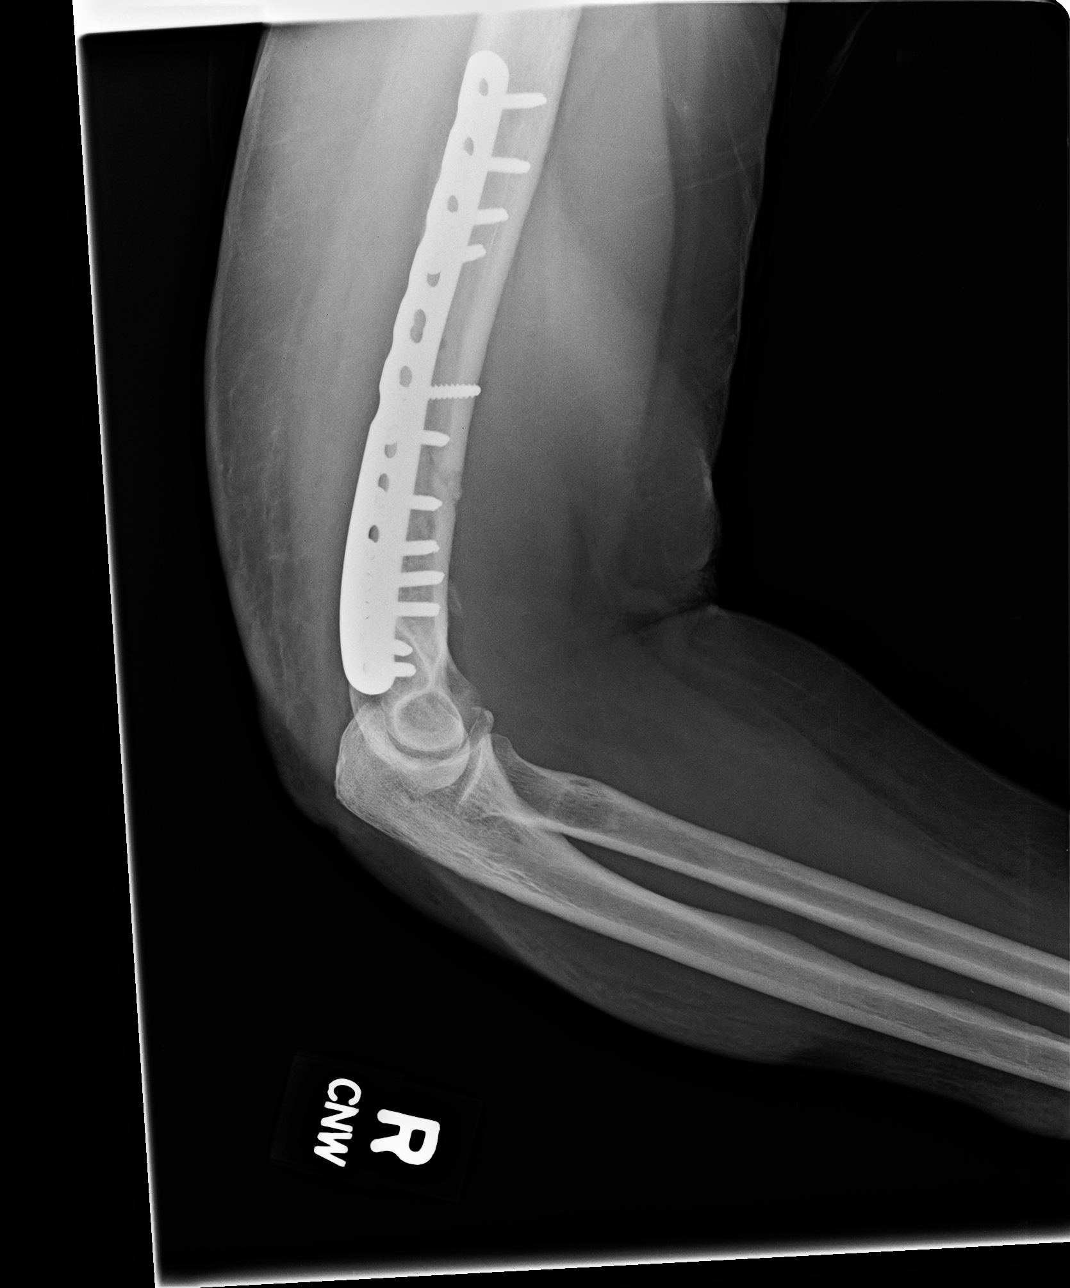

[4 of 4 positions shown; findings below may reference images not displayed]

EXAM

RADIOLOGICAL EXAMINATION, ELBOW; 3 VIEWS OR MORE COMPLETE CPT 53797

INDICATION

FOLLOW-UP FRACTURE OF THE RIGHT ELBOW. PATIENT STATES SHE FELL ON 02/18/19. CNW/TJ

TECHNIQUE

3 views of the right elbow were acquired.

COMPARISONS

Previous examination dated as far back as 02/18/2019.

FINDINGS

Plate and screw fixation of a distal humeral fracture is redemonstrated. Anatomic alignment is
preserved. The elbow joint appears intact. There is no evidence of a joint effusion. Mild edema of
the subcutaneous soft tissues is noted.

IMPRESSION

Status post plate and screw fixation of a distal right humeral fracture.

Tech Notes:

F/U FX of RT elbow. PT states she fell 02/18/19. CNW/TJ

## 2019-04-30 IMAGING — CR UP_EXM
4 series · 4 of 4 positions shown · non-contrast
Comparison: none

[elbow ap]
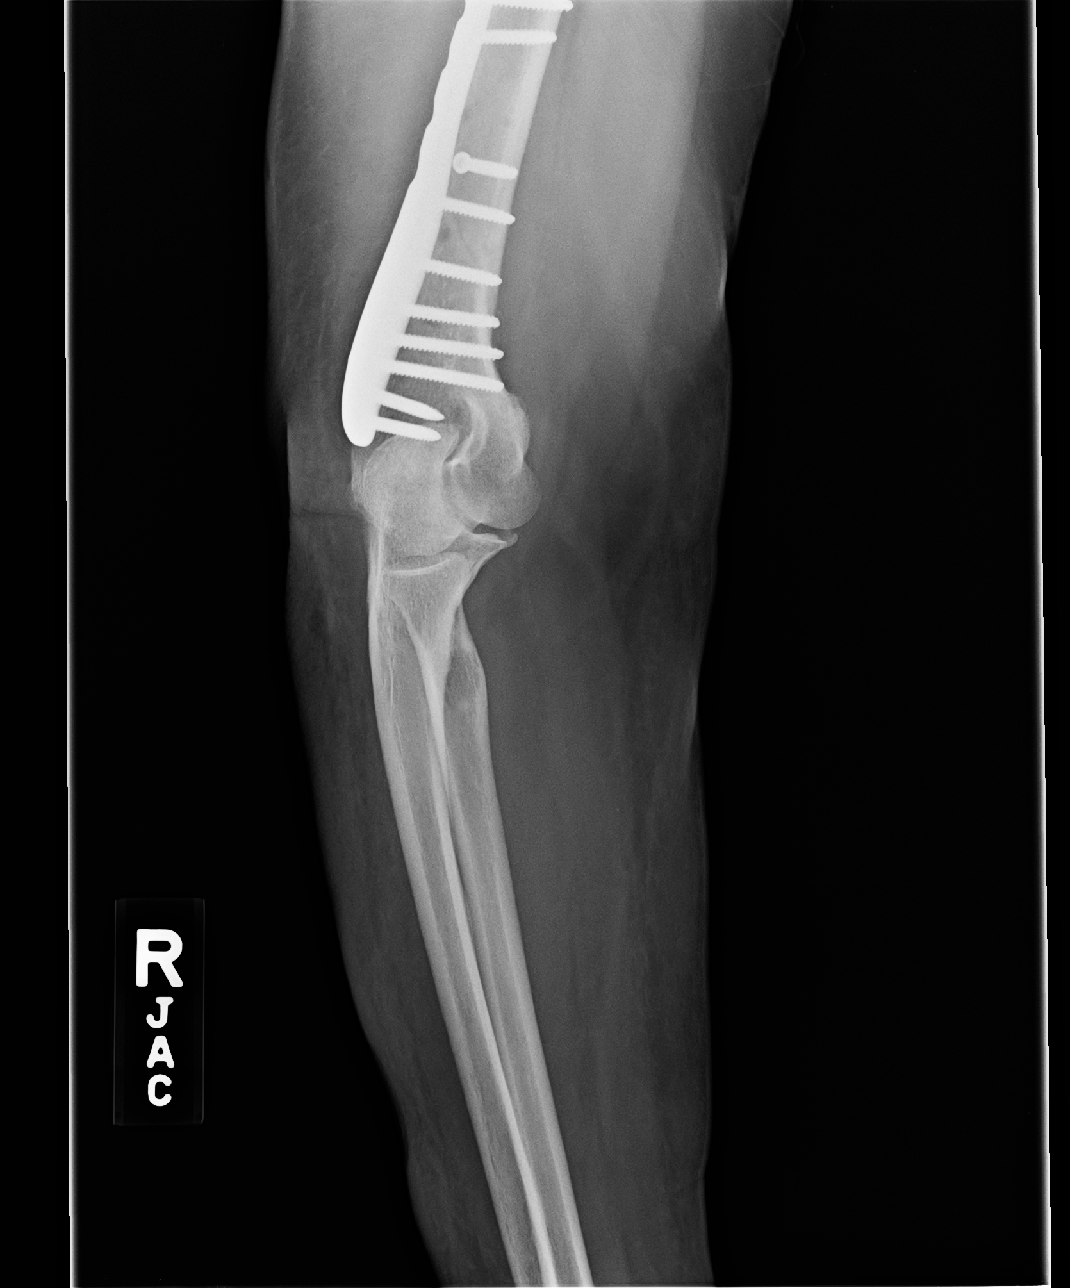

[elbow obl]
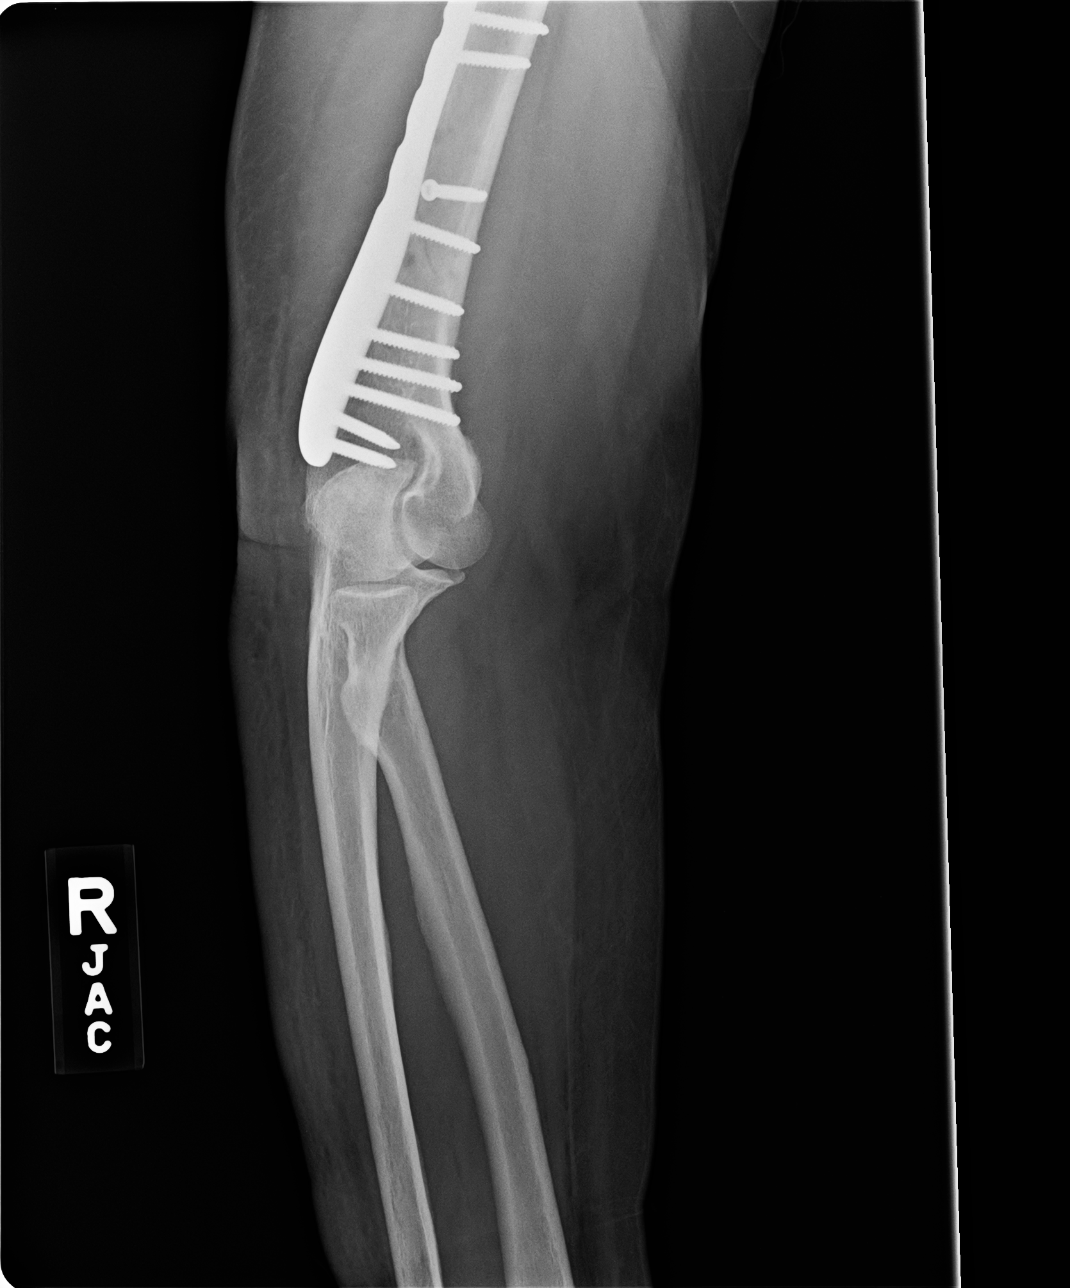

[elbow lat]
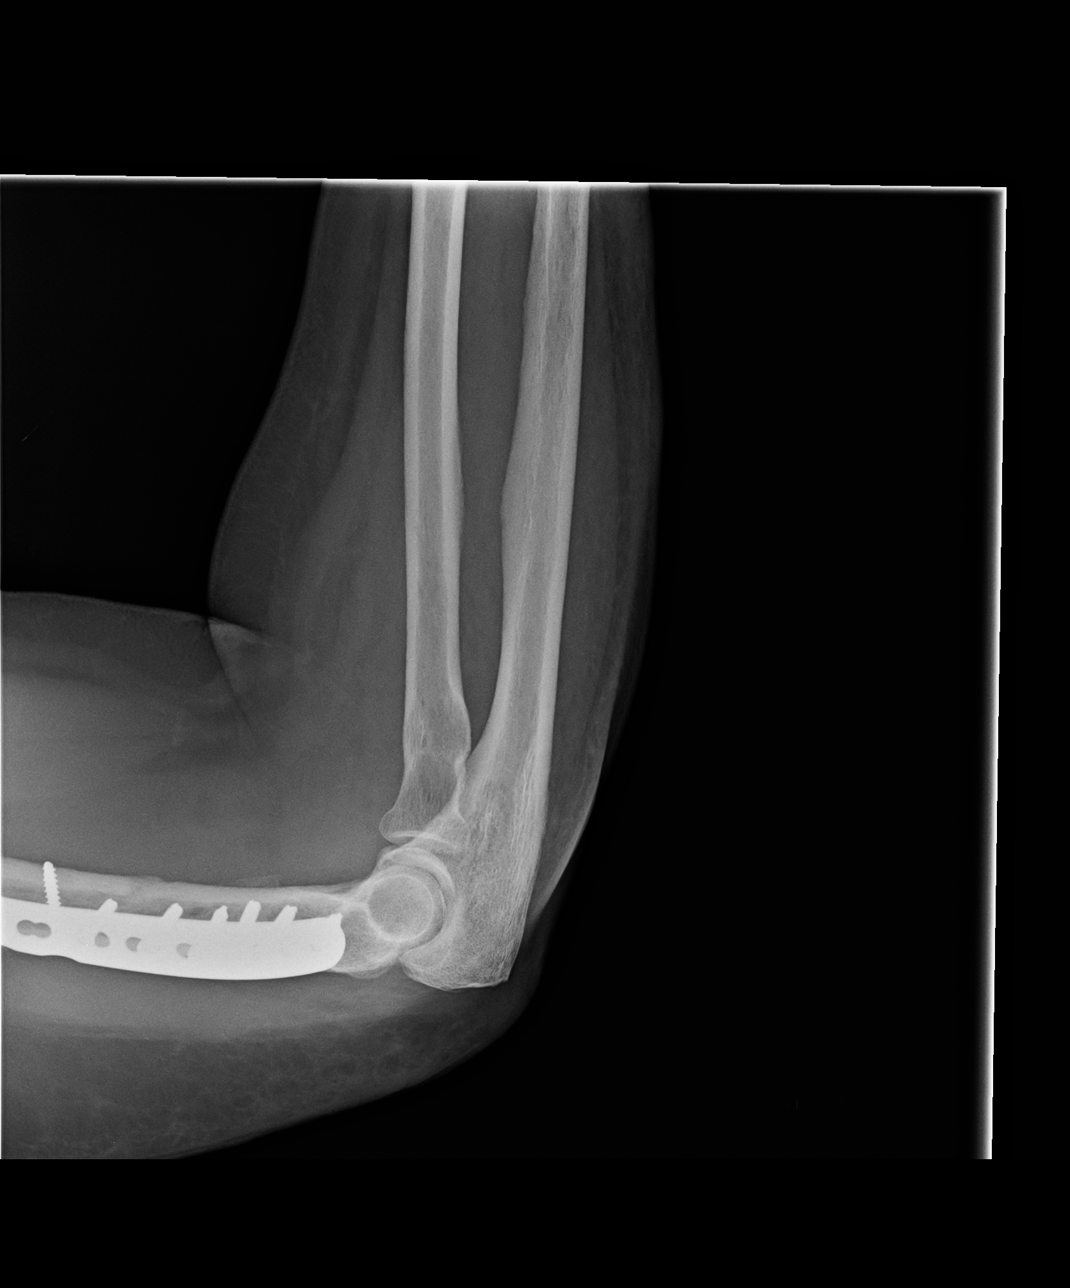

[humerus ap]
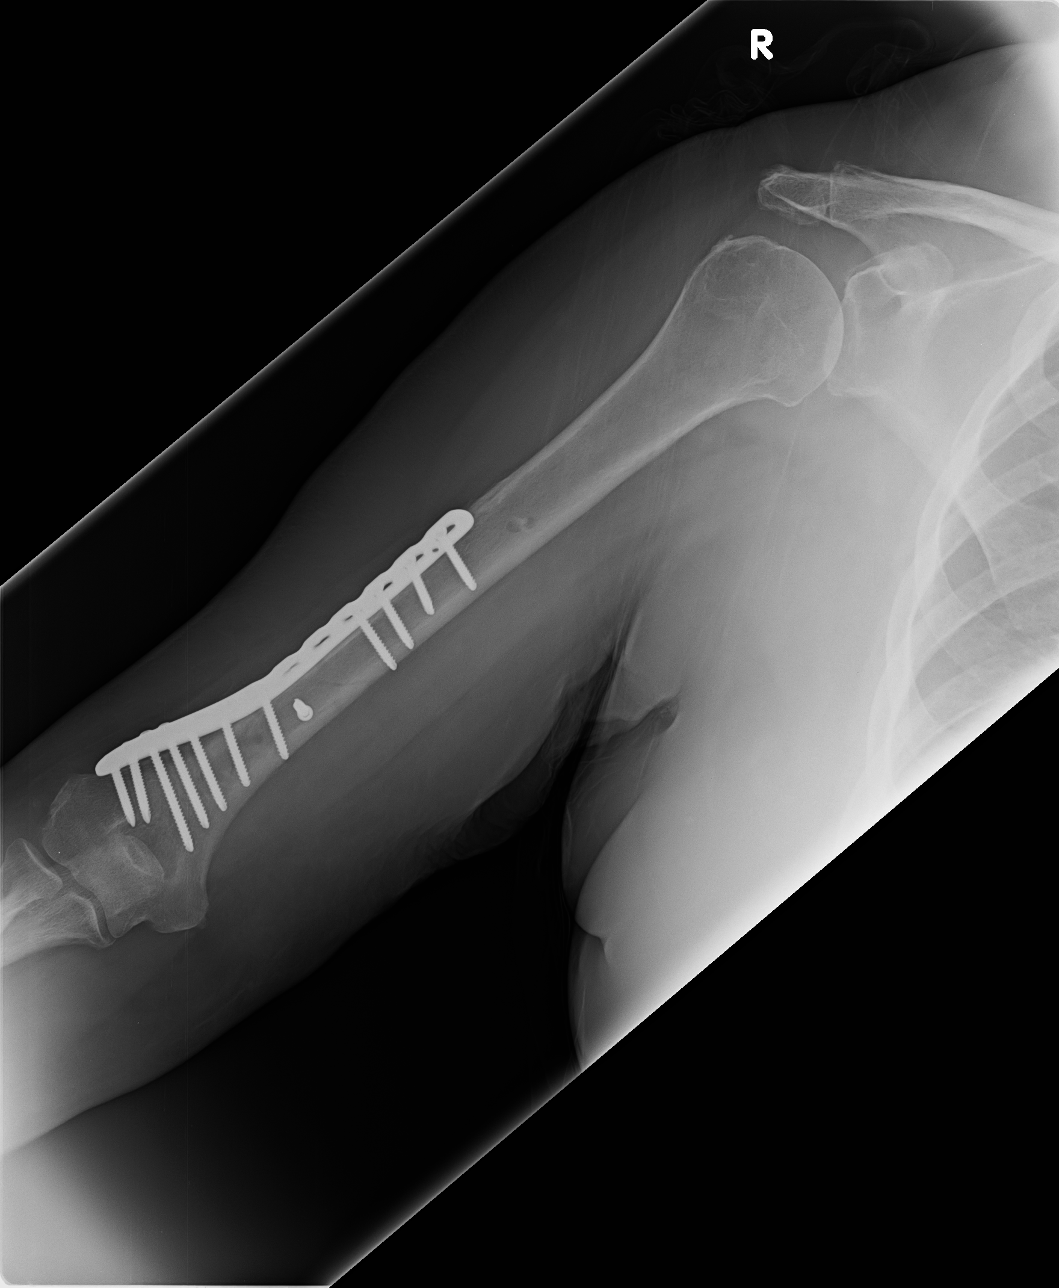

[4 of 4 positions shown; findings below may reference images not displayed]

EXAM

XR elbow RT min 3V

INDICATION

Fracture follow-up

TECHNIQUE

Four views of the right elbow

COMPARISONS

04/09/2019

FINDINGS

Laterally applied distal humeral low dynamic compression plate and interlocking screws. Additional
interfragmentary screw within the distal humeral diaphysis. Osseous bridging across the subtle
oblique fracture plane. Subtly decreased visualization of the fracture plane. No osseous lucency
along the hardware bone interfaces. No perihardware fracture or dislocation.

IMPRESSION
1. Continued osseous healing as above.

Tech Notes:

PT states F/U FX of RT elbow x 2 months ago. Limited ROM. JC/HB

## 2019-10-28 IMAGING — CT BRAIN WO(Adult)
3 of 4 series · 13 of 47 positions shown, 15 images · non-contrast
Comparison: none

[Series 2: brain ax 5.00 hr40 s3 · axial · 0.33mm/px · z∈[-524,-414]mm · 7 of 30 slices shown, 9 images]
[im 4/30  brain]
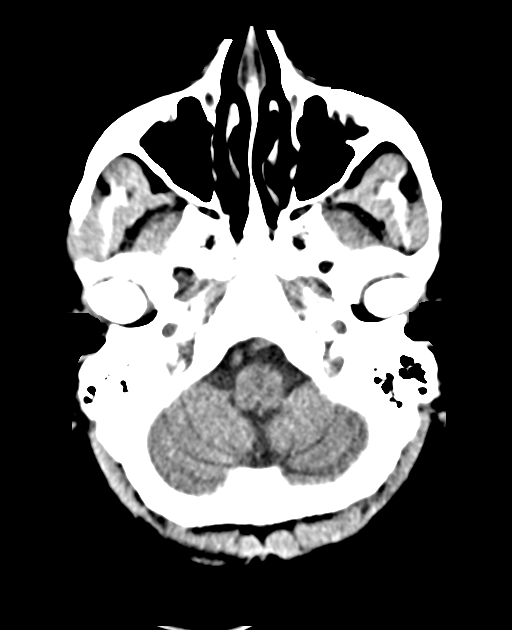
[im 4/30  bone]
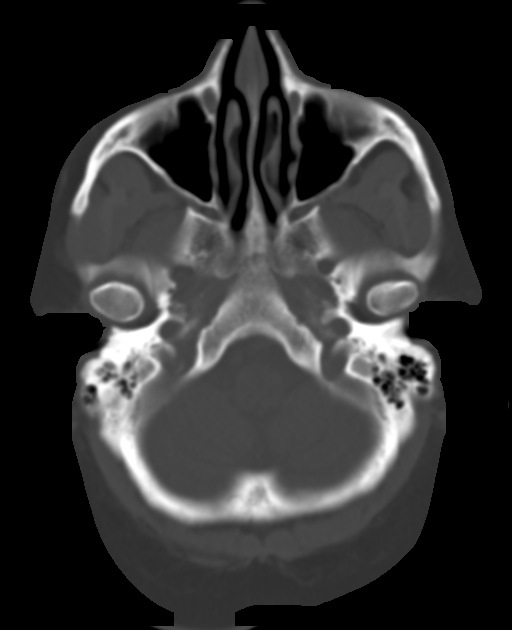
[im 8/30  brain]
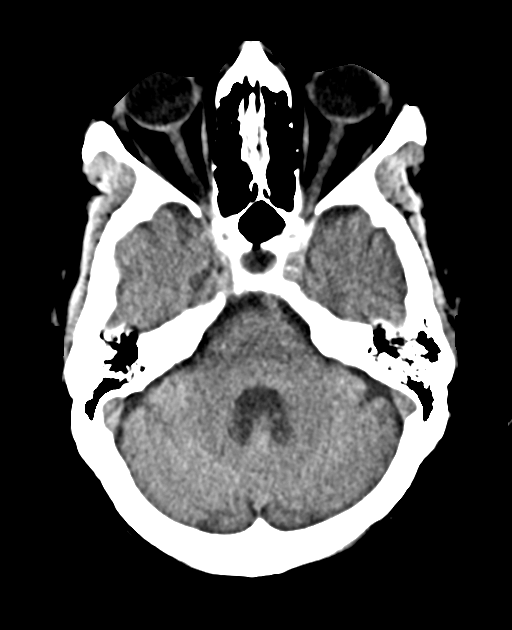
[im 11/30  brain]
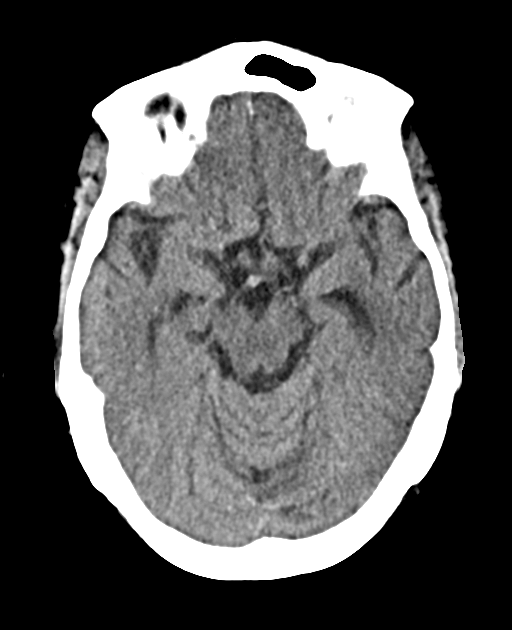
[im 15/30  brain]
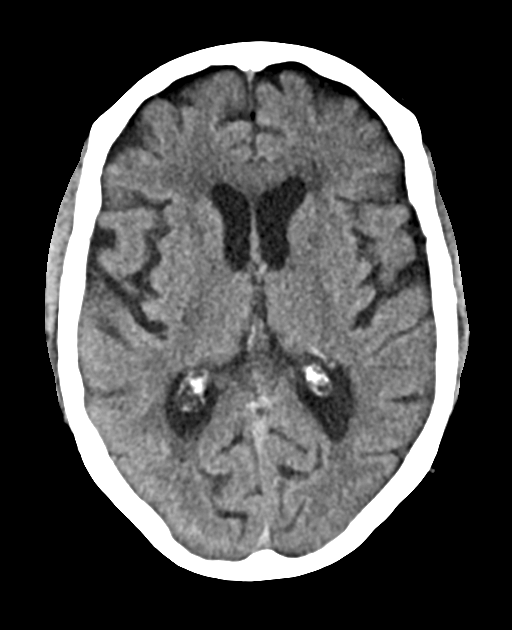
[im 19/30  brain]
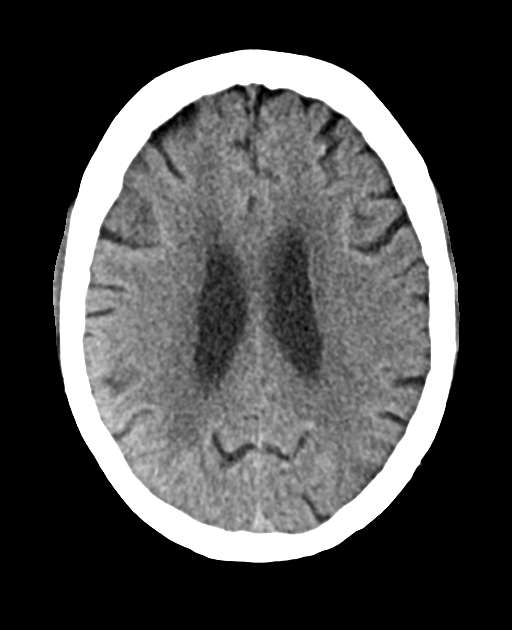
[im 19/30  bone]
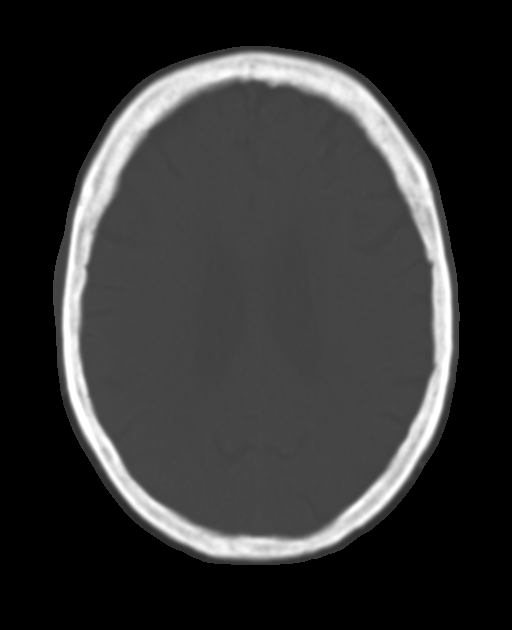
[im 22/30  brain]
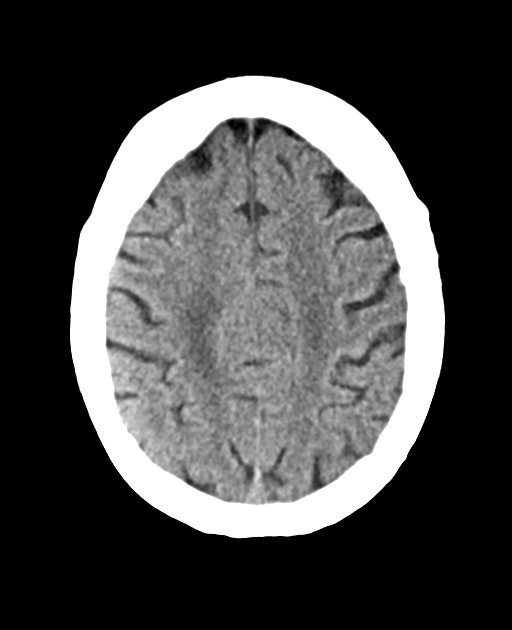
[im 26/30  brain]
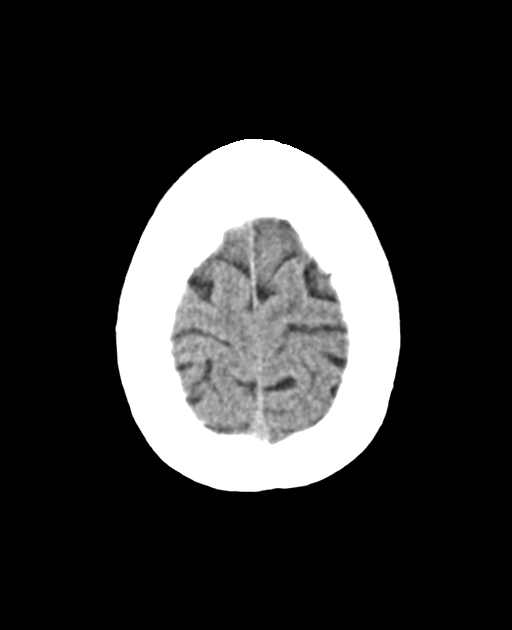

[Series 4: brain cor 5.00 hr40 s3 · coronal · 0.29mm/px · 3 of 41 slices shown]
[im 14/41  brain]
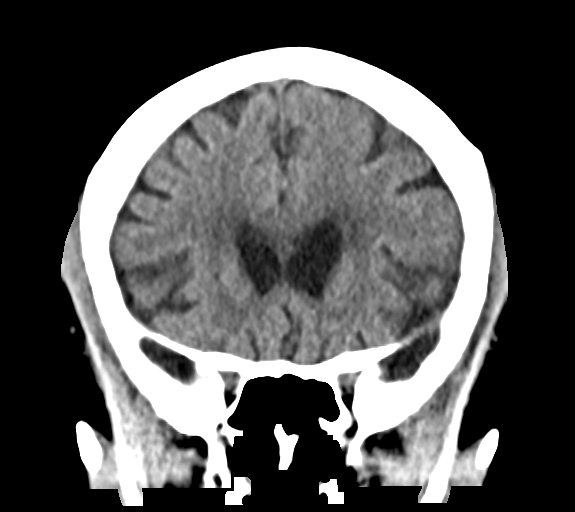
[im 18/41  brain]
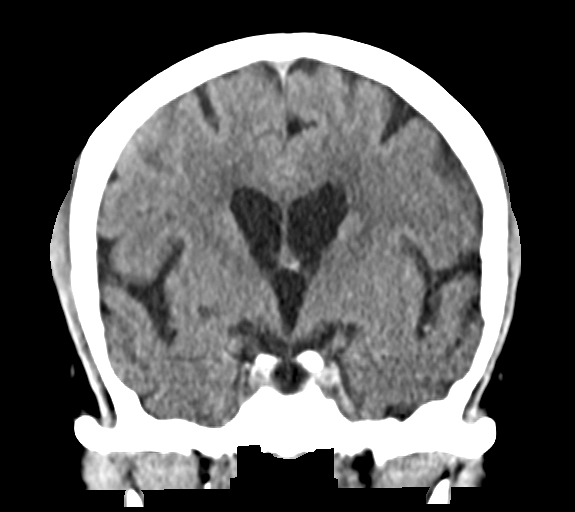
[im 23/41  brain]
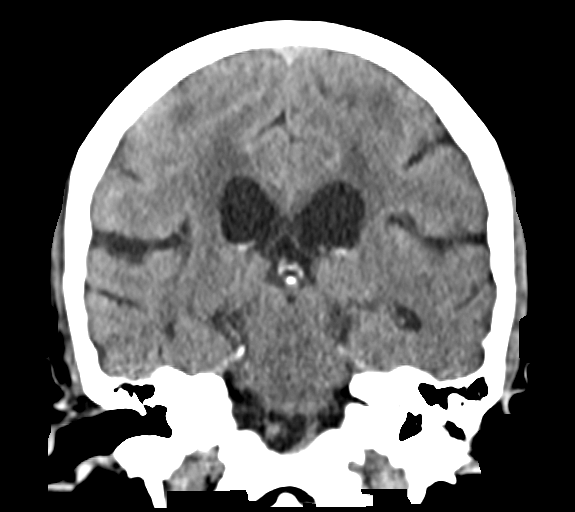

[Series 6: brain sag 5.00 hr40 s3 · sagittal · 0.29mm/px · 3 of 33 slices shown]
[im 11/33  brain]
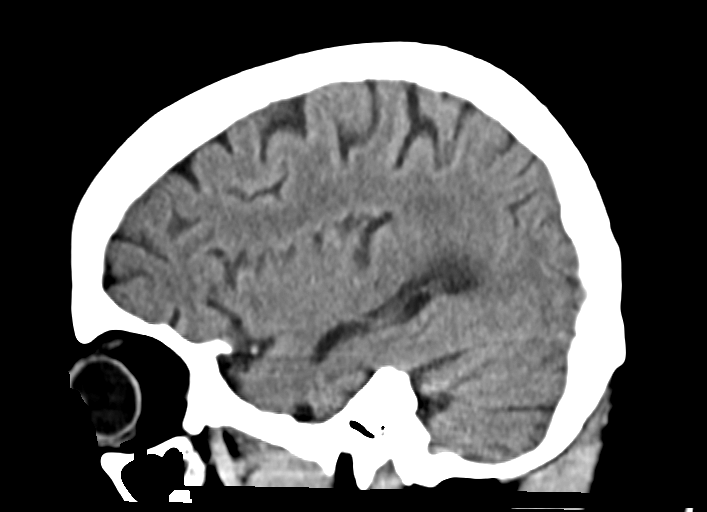
[im 17/33  brain]
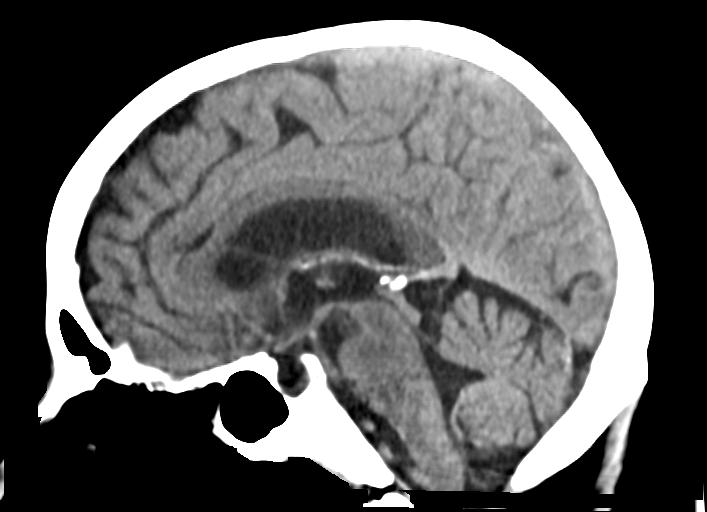
[im 22/33  brain]
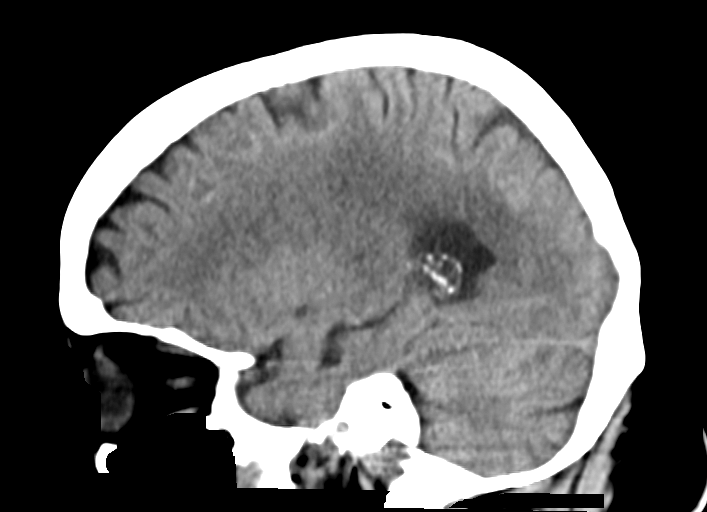

[13 of 47 positions shown; findings below may reference images not displayed]

DIAGNOSTIC STUDIES

EXAM

CT head without contrast.

INDICATION

gait imbalance
unsteady gait. dizziness, headaches. Ak

TECHNIQUE

All CT scans at this facility use dose modulation, iterative reconstruction, and/or weight based
dosing when appropriate to reduce radiation dose to as low as reasonably achievable.

Number of previous computed tomography exams in the last 12 months is 2.

Number of previous nuclear medicine myocardial perfusion studies in the last 12 months is 0  .

COMPARISONS

February 18, 2019

FINDINGS

Diffuse cortical atrophy is seen with and minimal small vessel ischemic change. Small right sub
insular chronic lacunar infarct is noted. No intracranial mass or hemorrhage is evident. Bony
calvarium is unremarkable.

IMPRESSION

No intracranial mass or hemorrhage.

Diffuse cortical atrophy and small vessel ischemic disease. Tiny chronic lacunar infarct is noted
in the right sub insular region..

Tech Notes:

unsteady gait. dizziness, headaches. Ak
ct/nm: 3/0

## 2019-10-28 IMAGING — MR Head^Brain
8 series · 39 of 48 positions shown · non-contrast
Comparison: Prior study same day, CT.

DIAGNOSTIC STUDIES

EXAM:  MR HEAD WITHOUT INTRAVENOUS CONTRAST  ([id number])
INDICATION: abnormal gait, abnormal head CT ABNORMAL GAIT, WEAKNESS, ABNORMAL CT WITH CHRONIC
INFARCT. RG
TECHNIQUE: Magnetic resonance images of the head/brain without intravenous contrast in multiple
planes.

[Series 2: T1 · sagittal · 5.0mm · 0.45mm/px · 4 of 19 slices shown (1 of 2)]
[im 1/19]
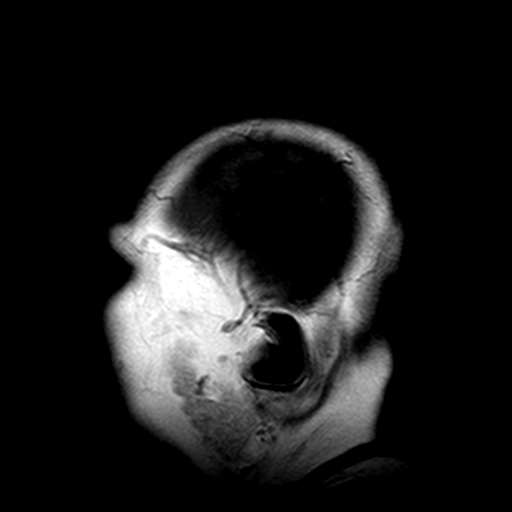
[im 7/19]
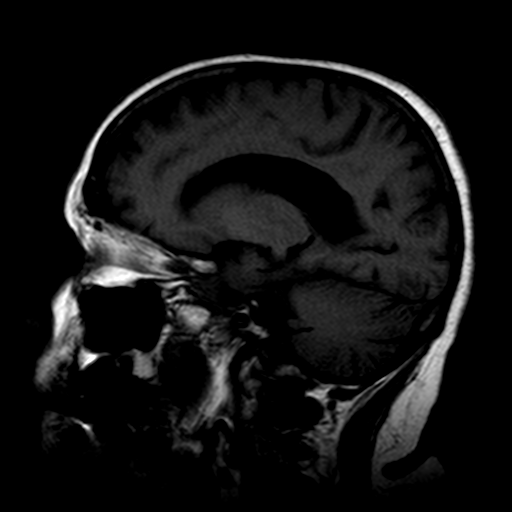
[im 13/19]
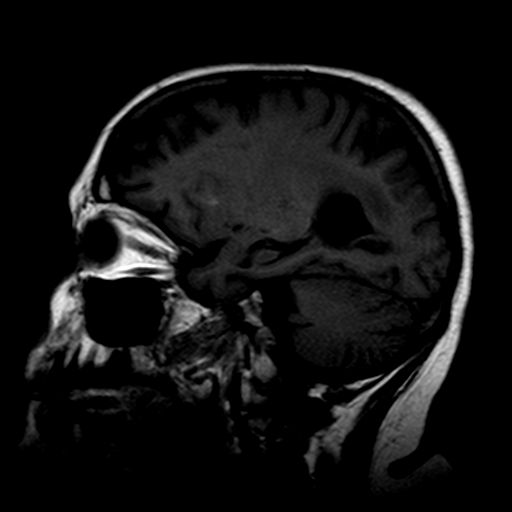
[im 19/19]
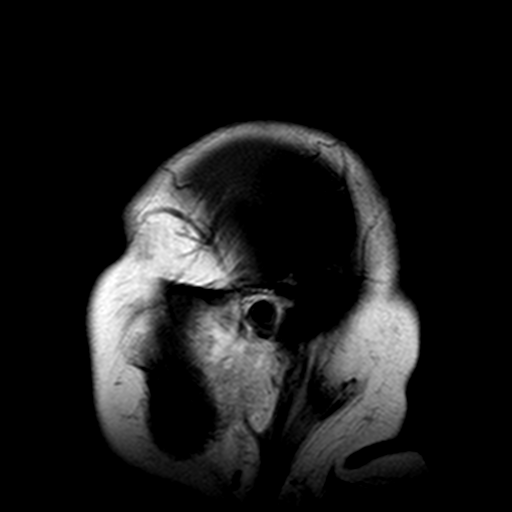

[Series 3: DWI · axial · 5.0mm · 1.80mm/px · z∈[-66,+63]mm · 8 of 61 slices shown (1 of 2)]
[im 1/61]
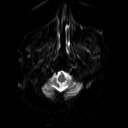
[im 10/61]
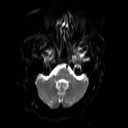
[im 19/61]
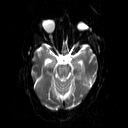
[im 28/61]
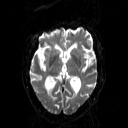
[im 33/61]
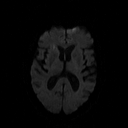
[im 42/61]
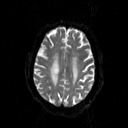
[im 51/61]
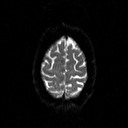
[im 61/61]
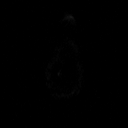

[Series 4: DWI · axial · 5.0mm · 1.80mm/px · z∈[-66,+63]mm · 5 of 21 slices shown (2 of 2)]
[im 1/21]
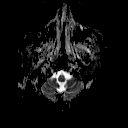
[im 6/21]
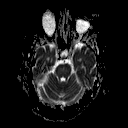
[im 11/21]
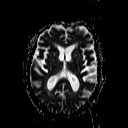
[im 16/21]
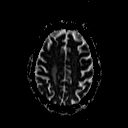
[im 21/21]
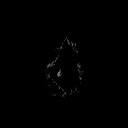

[Series 5: FLAIR · axial · 5.0mm · 0.45mm/px · z∈[-88,+60]mm · 5 of 22 slices shown]
[im 1/22]
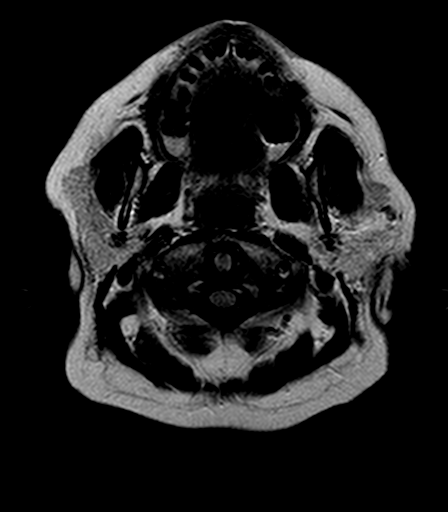
[im 6/22]
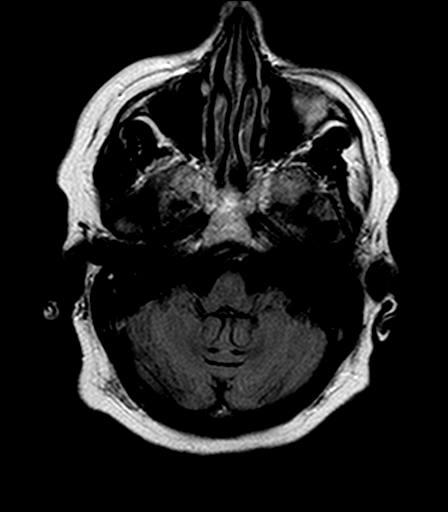
[im 11/22]
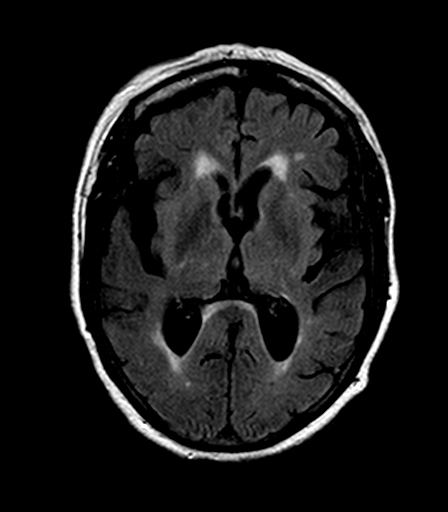
[im 16/22]
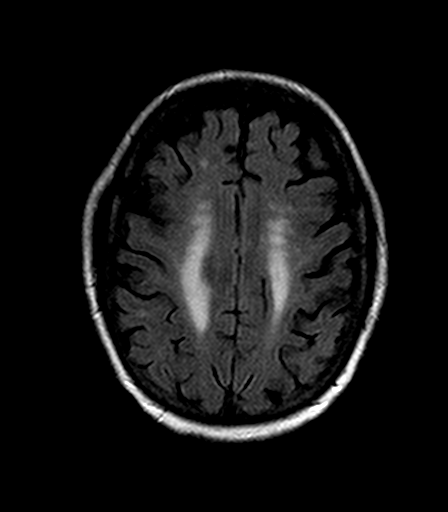
[im 22/22]
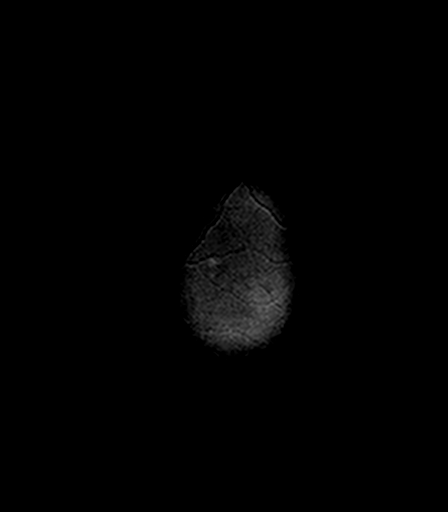

[Series 6: T2 · axial · 5.0mm · 0.72mm/px · z∈[-88,+60]mm · 5 of 23 slices shown (1 of 2)]
[im 1/23]
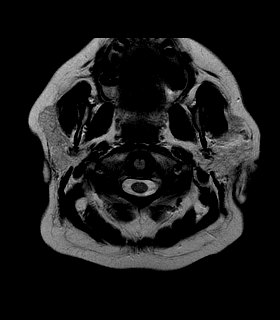
[im 6/23]
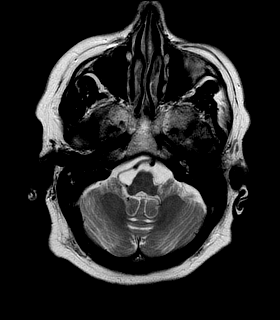
[im 12/23]
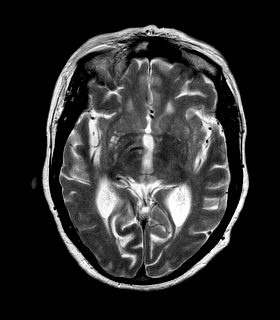
[im 17/23]
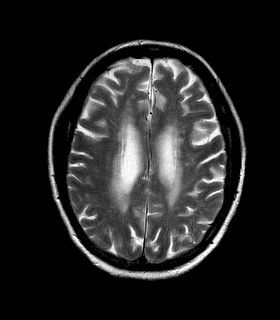
[im 23/23]
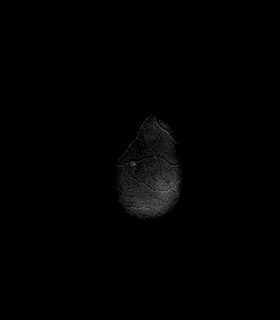

[Series 7: T1 · axial · 5.0mm · 0.45mm/px · z∈[-88,+60]mm · 5 of 24 slices shown (2 of 2)]
[im 1/24]
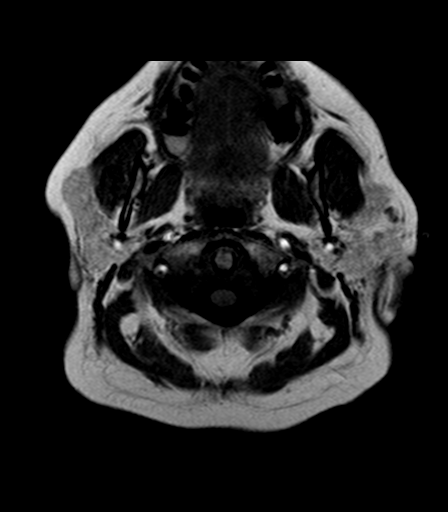
[im 6/24]
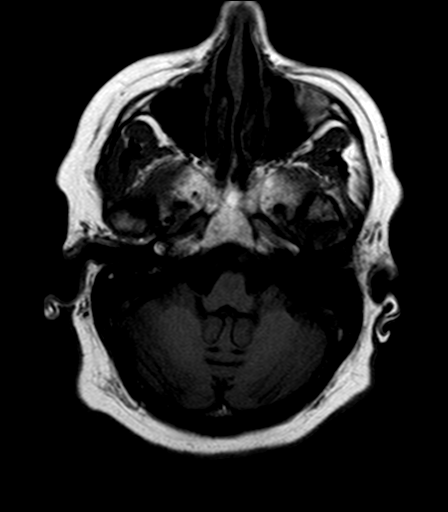
[im 12/24]
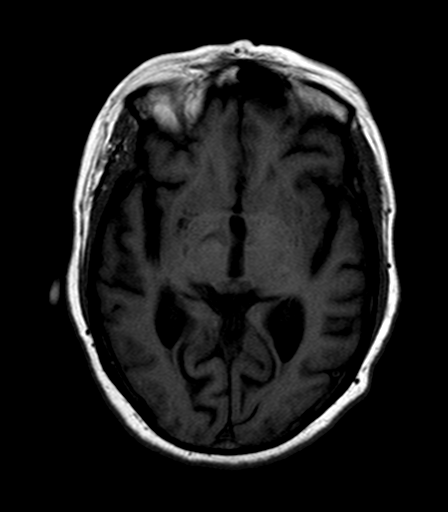
[im 18/24]
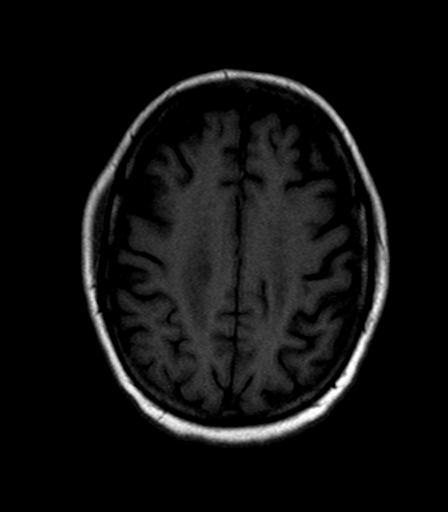
[im 24/24]
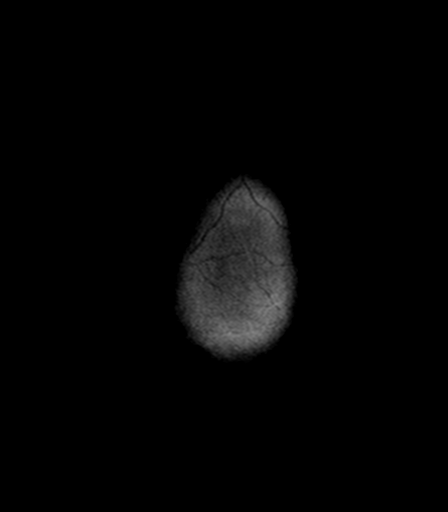

[Series 8: axial blood · axial · 5.0mm · 0.45mm/px · 1 of 20 slices shown]
[im 1/20]
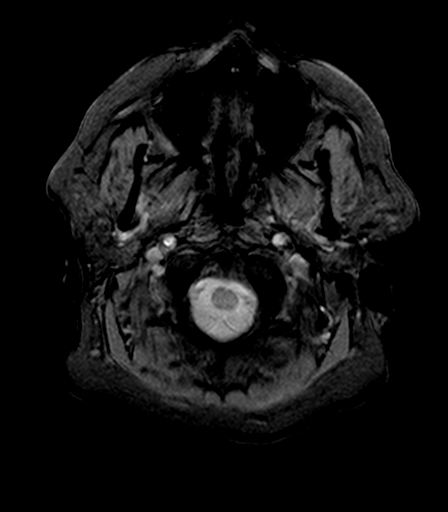

[Series 9: T2 · coronal · 5.0mm · 0.69mm/px · 6 of 26 slices shown (2 of 2)]
[im 1/26]
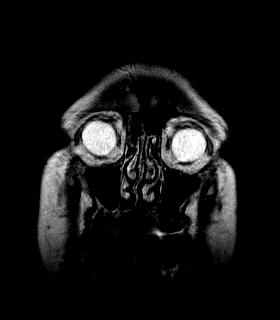
[im 6/26]
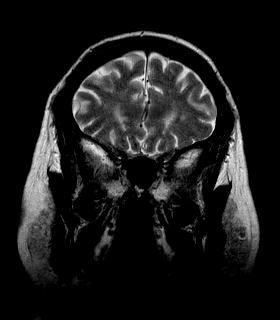
[im 11/26]
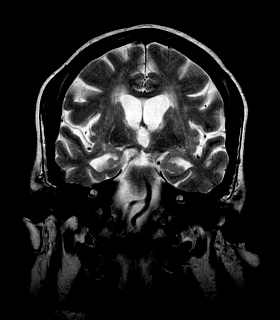
[im 16/26]
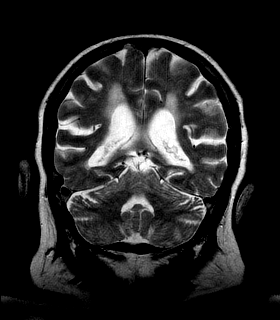
[im 21/26]
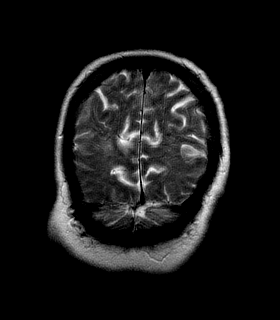
[im 26/26]
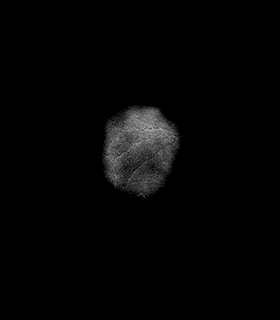

[39 of 48 positions shown; findings below may reference images not displayed]

FINDINGS: BRAIN:  Diffusion and ADC map studies demonstrate NO definite evidence of acute/subacute ischemic
changes.

Posterior fossa demonstrates NO evidence of cerebellar hemorrhage or mass.

Central cerebral vessels demonstrate NO gross occlusion, significant (greater than 50%) stenosis,
aneurysm or vascular malformation.

NO evidence of supratentorial hemorrhage or mass.

Moderate patchy and focal white matter changes.

VENTRICLES:  Minimal enlargement of the ventricles.

BONES/JOINTS:  No definite acute findings.

SINUSES:  The paranasal sinuses demonstrate NO opacifications or air-fluid levels.LEFT mastoid air
cells are clear.

MASTOID AIR CELLS:  Minimal in transabdominal and patchy opacification in the RIGHT mastoid.

ORBITS:  Unremarkable as visualized.

OTHER FINDINGS:  SPINAL CORD: Visualized portions of the spinal cord demonstrate NO acute changes.
IMPRESSION: - Diffusion and ADC map studies demonstrate NO definite evidence of acute/subacute ischemic
changes.

- NO evidence of acute intracranial hemorrhage or mass.

- Minimal in transabdominal and patchy opacification in the RIGHT mastoid.  This suggests minimal
sinusitis. NO acute appearing destructive changes or coalescence to suggest mastoiditis.

- Minimal enlargement of the ventricles.  NO change from prior study.

- Findings suggest chronic small vessel ischemic changes. NO gross change from prior study.

Tech Notes:

ABNORMAL GAIT, WEAKNESS, ABNORMAL CT WITH CHRONIC INFARCT.  RG

## 2019-10-28 IMAGING — CT L_SPINE_(Adult)
3 of 4 series · 12 of 33 positions shown, 14 images · non-contrast
Comparison: none

[Series 2: l-spine ax 2.00 br60 s3 · axial · 0.27mm/px · z∈[-1059,-905]mm · 4 of 112 slices shown, 5 images]
[im 19/112  soft-tissue]
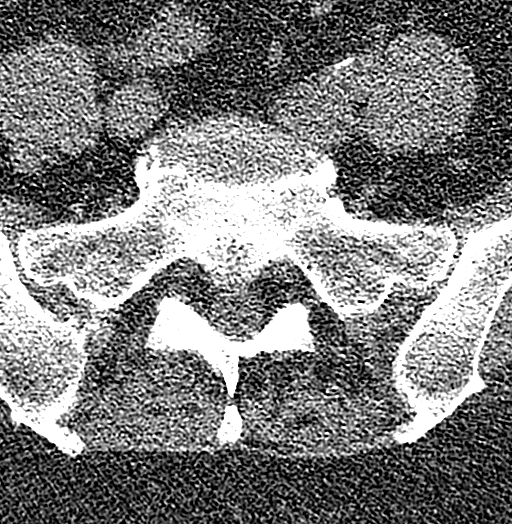
[im 19/112  bone]
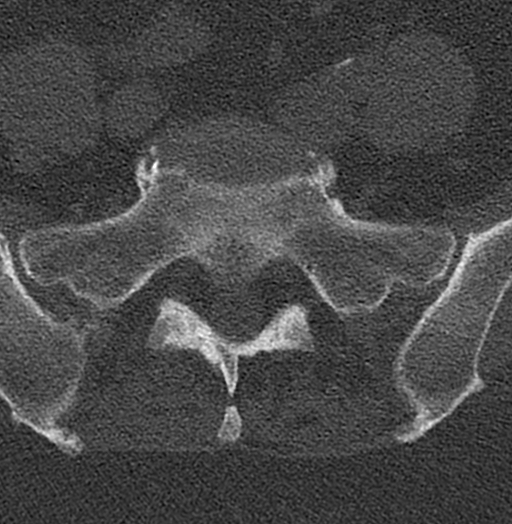
[im 38/112  bone]
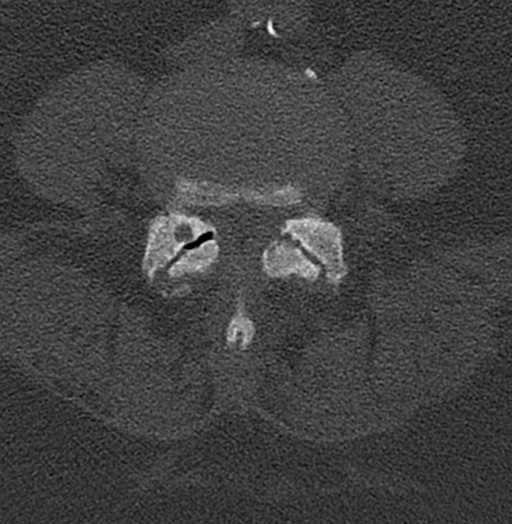
[im 75/112  bone]
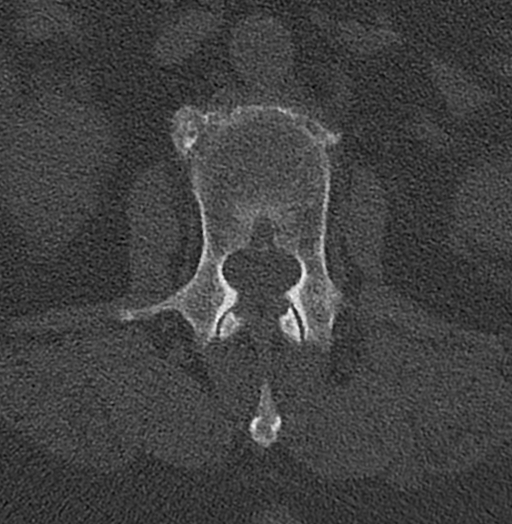
[im 93/112  bone]
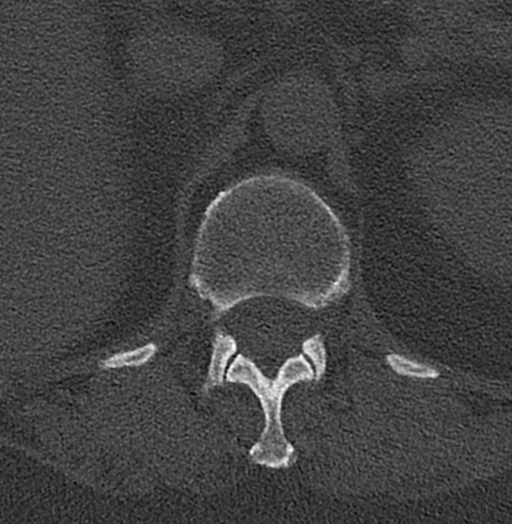

[Series 4: l-spine cor 2.00 br60 s3 · coronal · 0.27mm/px · 3 of 68 slices shown]
[im 14/68  bone]
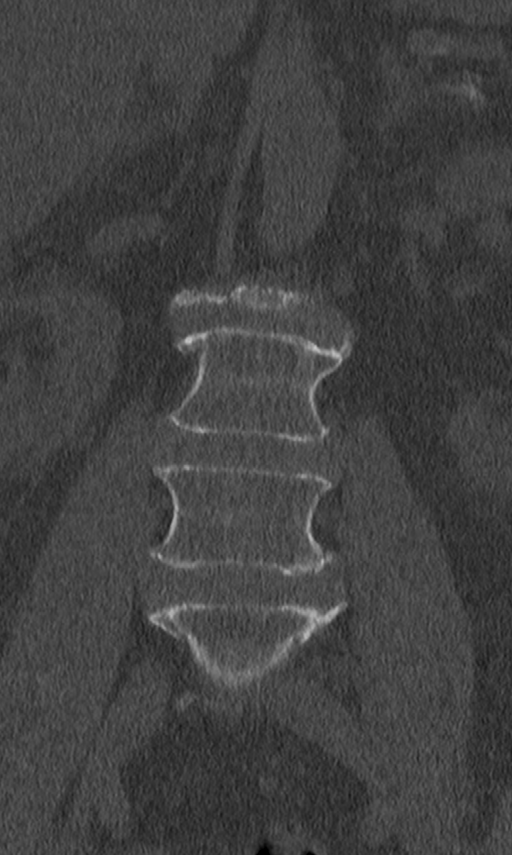
[im 27/68  bone]
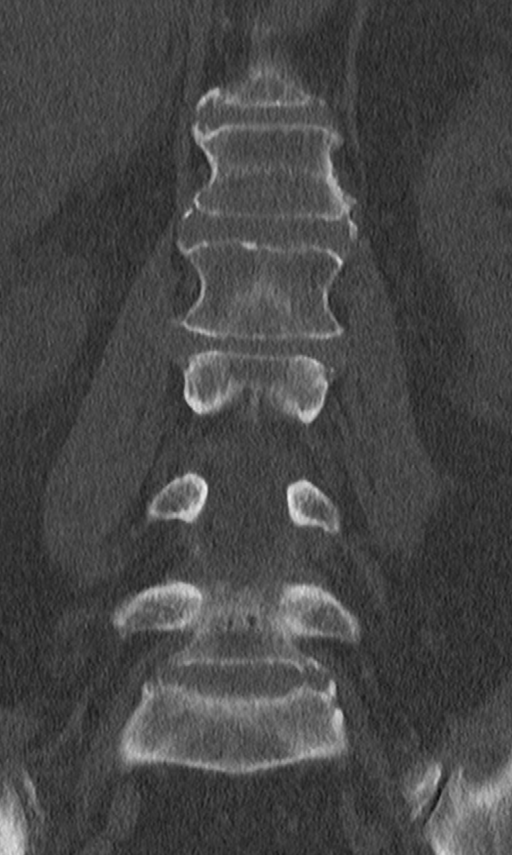
[im 41/68  bone]
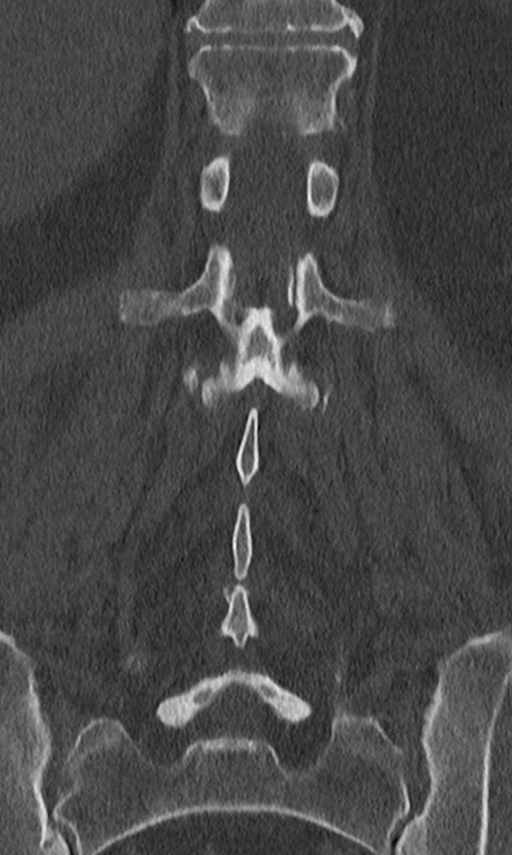

[Series 6: l-spine sag 2.00 br60 s3 · sagittal · 0.28mm/px · 5 of 66 slices shown, 6 images]
[im 22/66  bone]
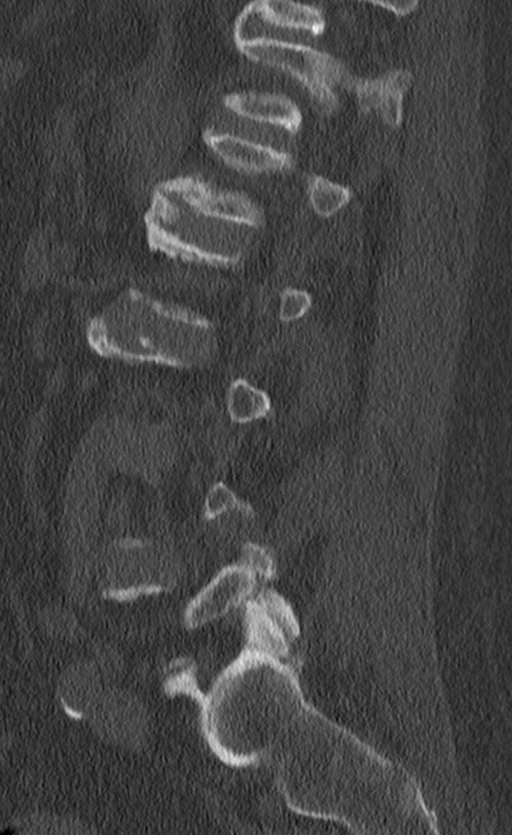
[im 28/66  bone]
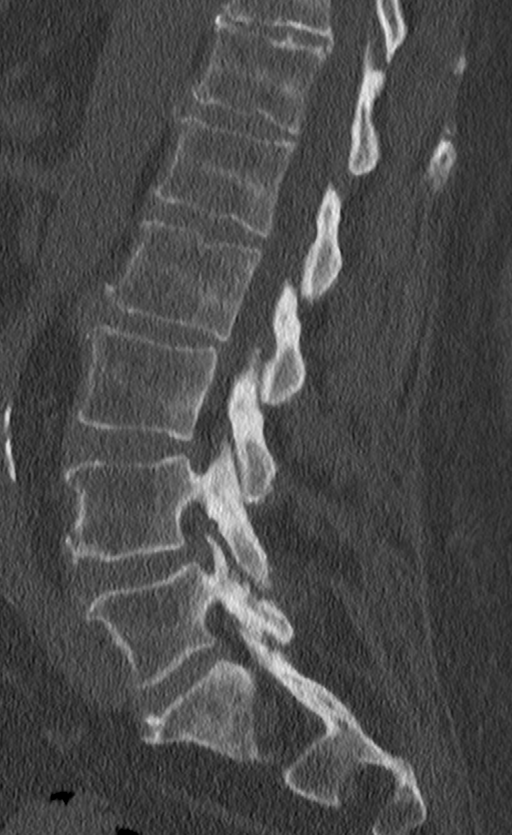
[im 33/66  soft-tissue]
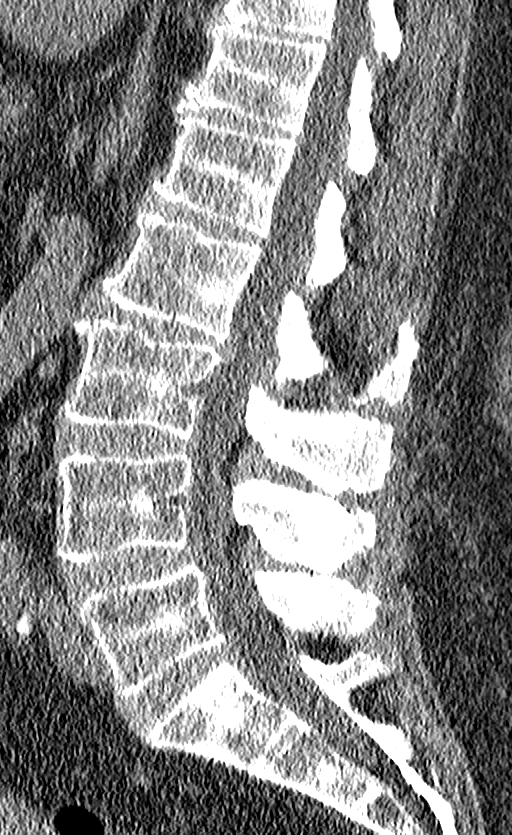
[im 33/66  bone]
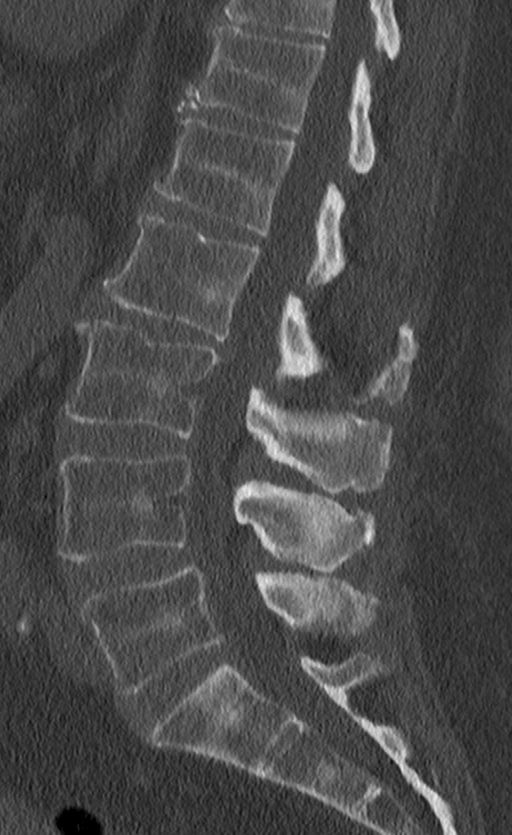
[im 38/66  bone]
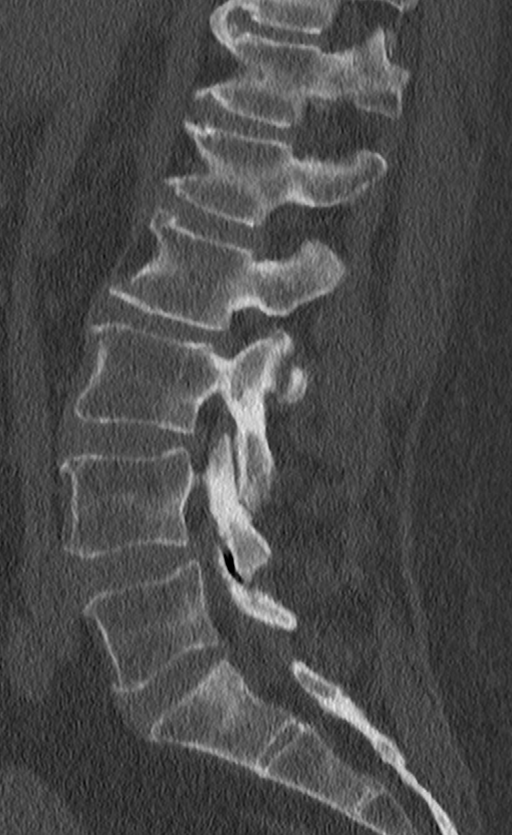
[im 44/66  bone]
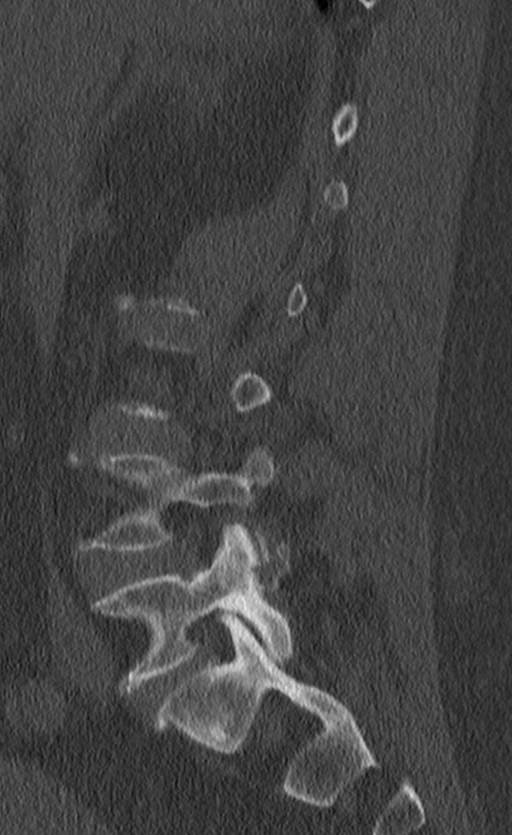

[12 of 33 positions shown; findings below may reference images not displayed]

DIAGNOSTIC STUDIES

EXAM

CT lumbar spine without contrast.

INDICATION

low back pain, leg weakness
fall in 0936, back pain since. recent leg weakness. Ak  ct/nm: 3/0

TECHNIQUE

All CT scans at this facility use dose modulation, iterative reconstruction, and/or weight based
dosing when appropriate to reduce radiation dose to as low as reasonably achievable.

Number of previous computed tomography exams in the last 12 months is 3.

Number of previous nuclear medicine myocardial perfusion studies in the last 12 months is 0  .

COMPARISONS

None available

FINDINGS

There are degenerative changes of the vertebral endplates throughout the lumbar spine and
associated facet hypertrophy at all levels. No compression fractures or other fractures are evident.
Calcification of the abdominal pelvic vasculature is noted.

IMPRESSION

Multilevel degenerative changes lumbar spine associated facet hypertrophy. No fractures are seen.
This report and CT head report called to Dr. Flandre at [DATE] p.m..

Tech Notes:

fall in 0936, back pain since. recent leg weakness. Ak
ct/nm: 3/0

## 2019-10-29 IMAGING — US CARDUPBI
1 series · 14 of 16 positions shown · non-contrast
Comparison: none

[Series 1: us carotid duplex bi · 14 of 65 slices shown]
[im 1/65]
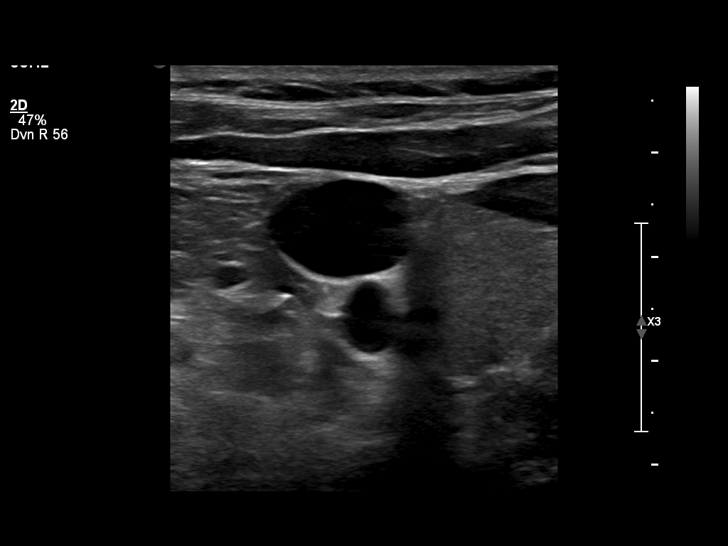
[im 5/65]
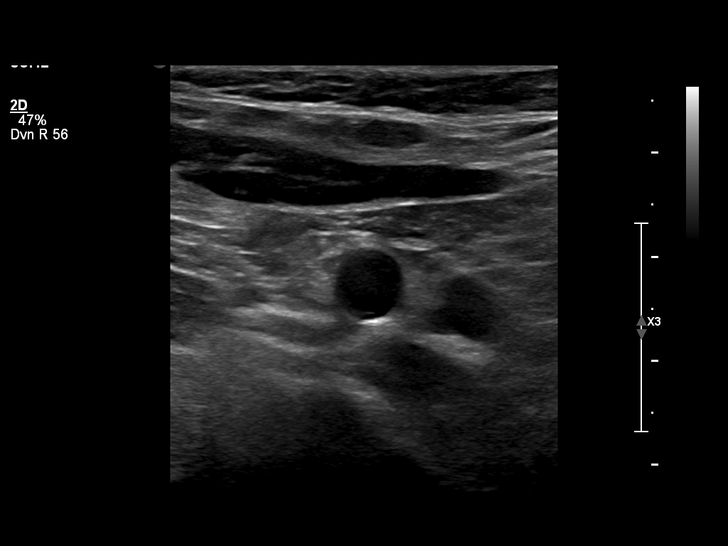
[im 9/65]
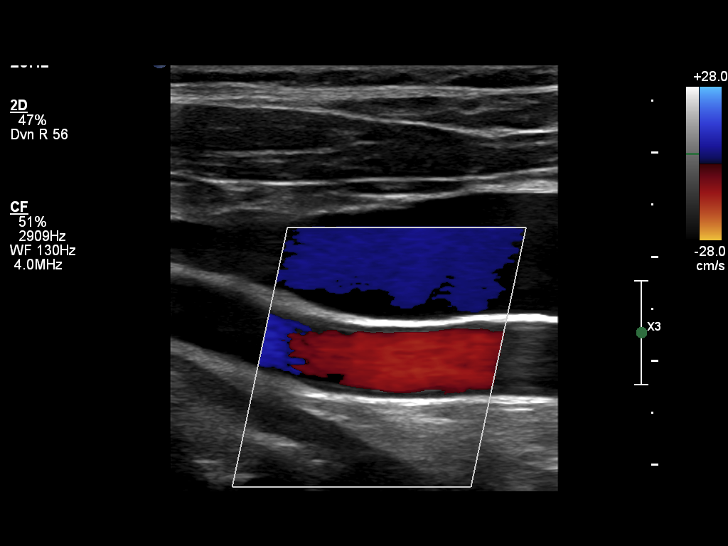
[im 18/65]
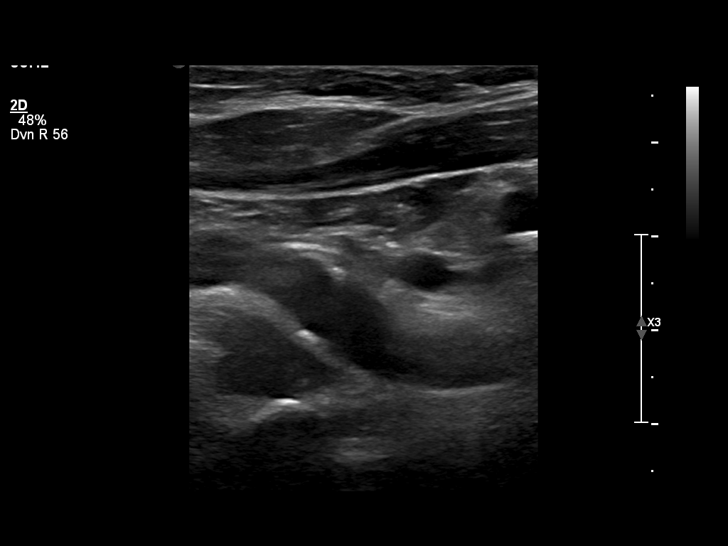
[im 22/65]
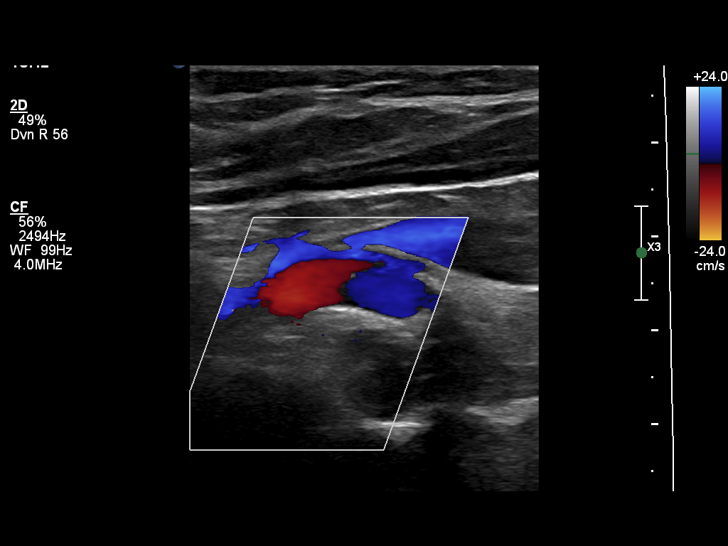
[im 26/65]
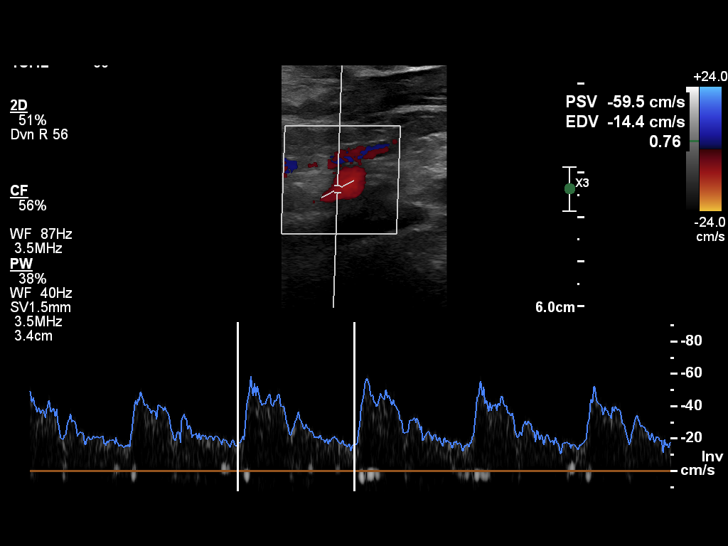
[im 30/65]
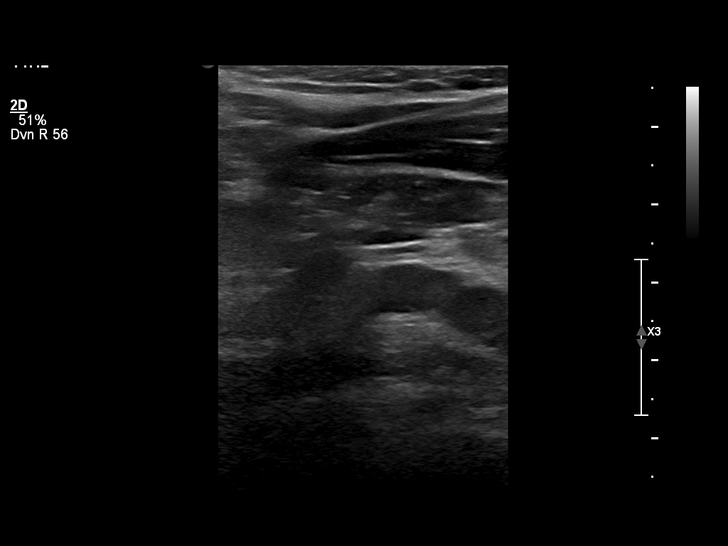
[im 35/65]
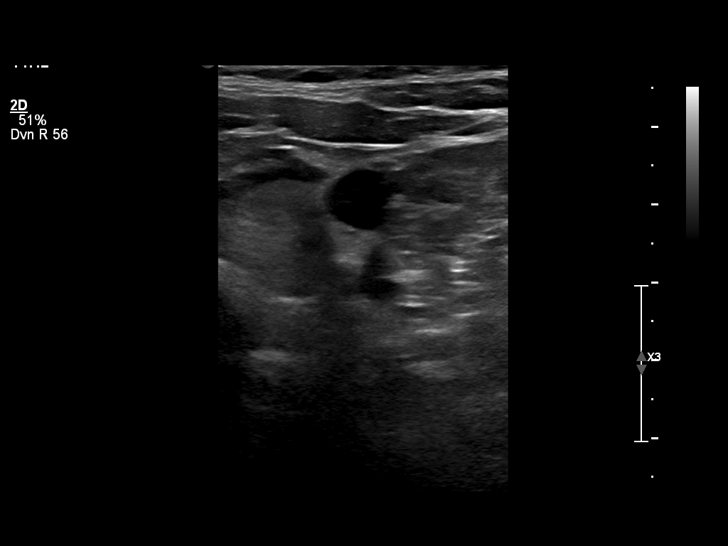
[im 39/65]
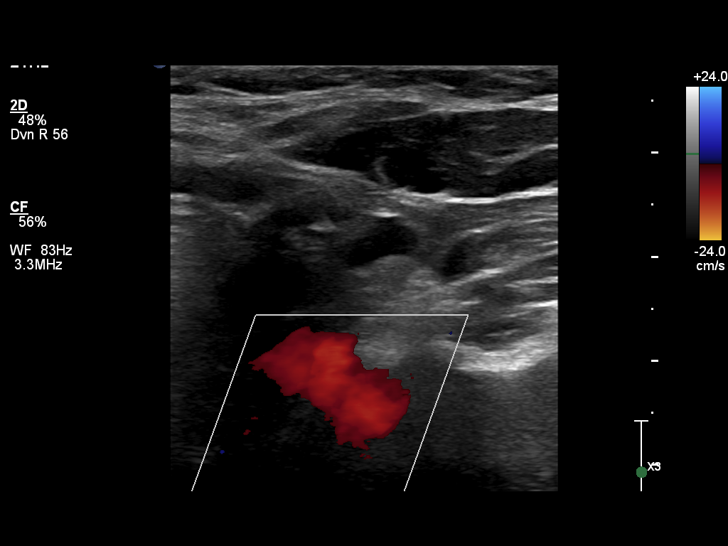
[im 43/65]
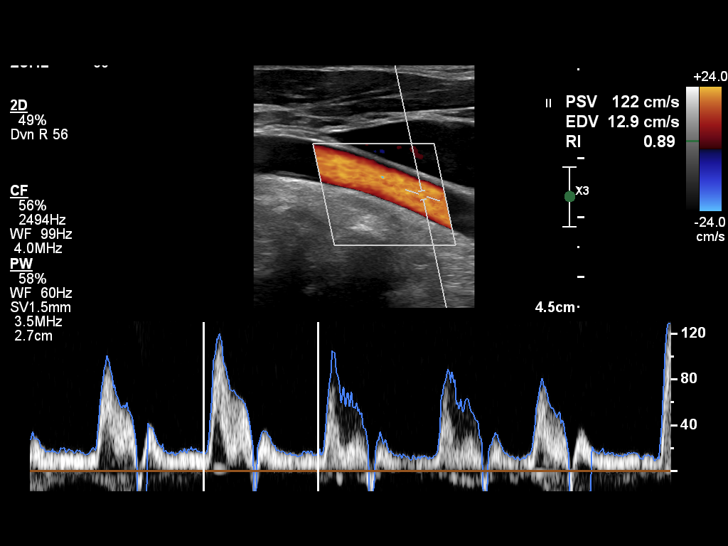
[im 52/65]
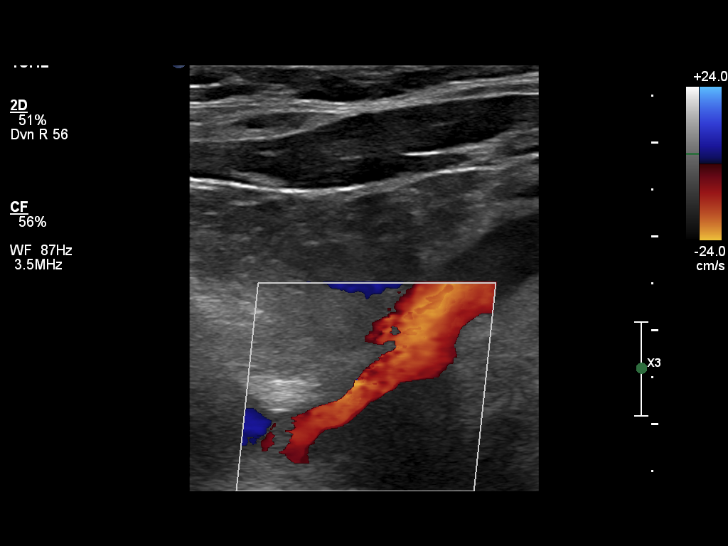
[im 56/65]
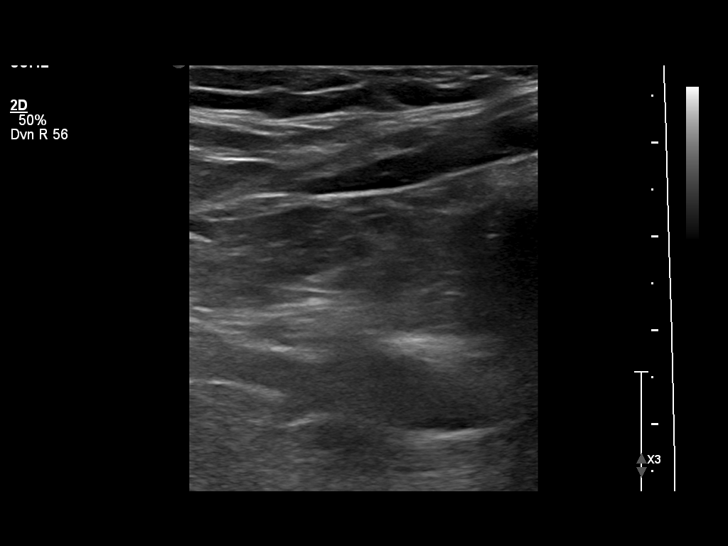
[im 60/65]
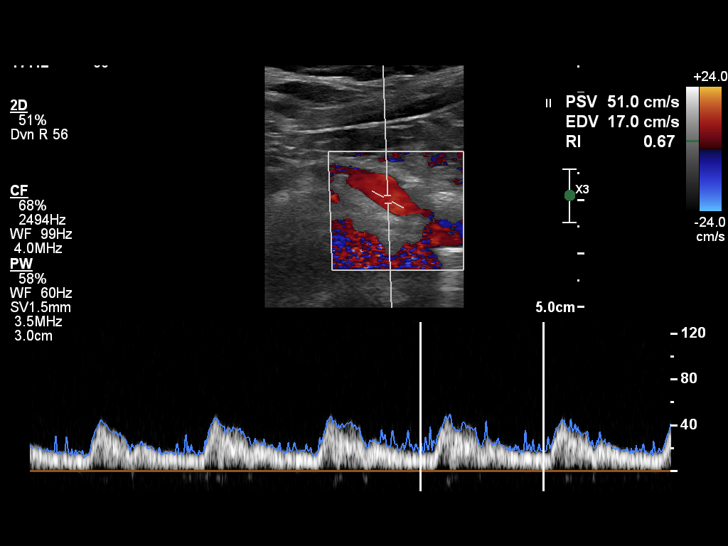
[im 65/65]
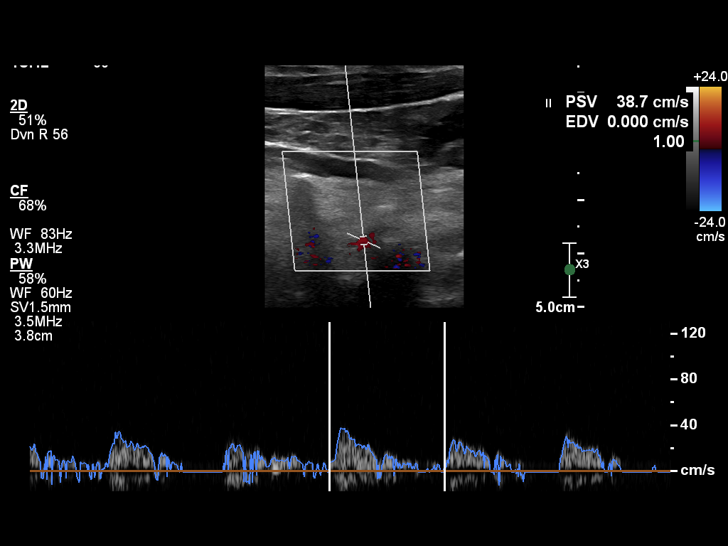

[14 of 16 positions shown; findings below may reference images not displayed]

EXAM

Carotid duplex ultrasound

INDICATION

CVA vs TIA - gait instability - slurred speech

FINDINGS

Plaque disease identified bilaterally which is mild.

The velocities are within normal limits as are the ICA/CCA ratios.

There is forward vertebral flow.

IMPRESSION

NASCET criteria suggest stenoses in the 0-49 percent range.

Tech Notes:

## 2019-10-29 IMAGING — MR Head^Brain
2 series · 43 of 43 positions shown · IV contrast (with contrast)
Comparison: Prior study of yesterday.

DIAGNOSTIC STUDIES

EXAM:  MR HEAD WITH INTRAVENOUS CONTRAST  ([id number])
INDICATION: OLD INFARCT WEAKNESS, CONFUSION, PRESENTED TO ER YESTERDAY. ABNORMAL CT. 15 ML
GADAVIST. RG
TECHNIQUE: Magnetic resonance images of the head/brain with intravenous contrast in multiple
planes.
CONTRAST:  15 mL of Gadavist was intravenously administered .

[Series 2: T1 fat-sat post-contrast · axial · 5.0mm · 0.90mm/px · z∈[-75,+63]mm · 19 of 19 slices shown (1 of 2)]
[im 1/19]
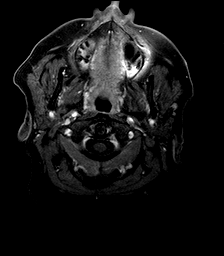
[im 2/19]
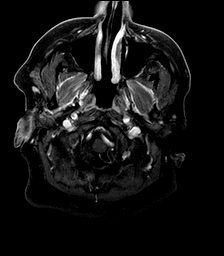
[im 3/19]
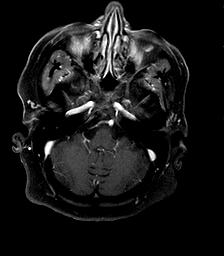
[im 4/19]
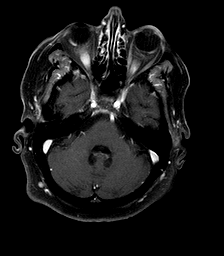
[im 5/19]
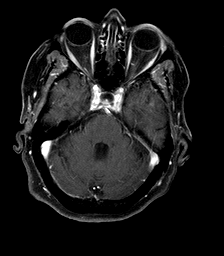
[im 6/19]
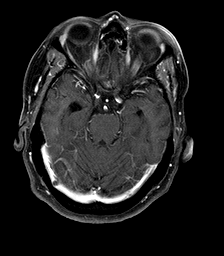
[im 7/19]
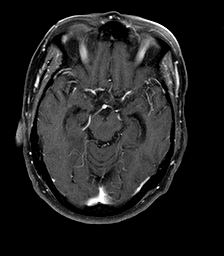
[im 8/19]
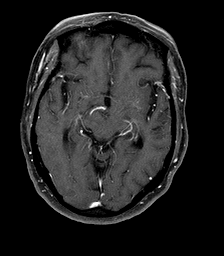
[im 9/19]
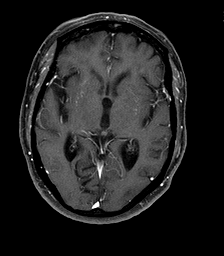
[im 10/19]
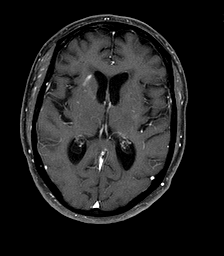
[im 11/19]
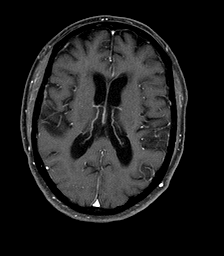
[im 12/19]
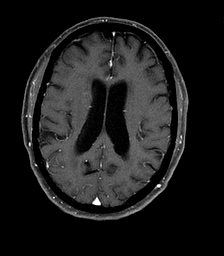
[im 13/19]
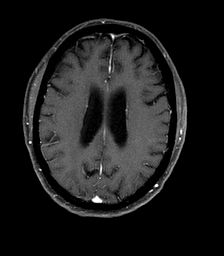
[im 14/19]
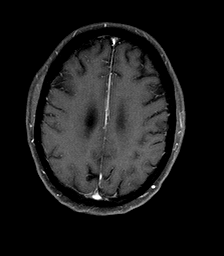
[im 15/19]
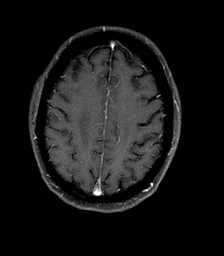
[im 16/19]
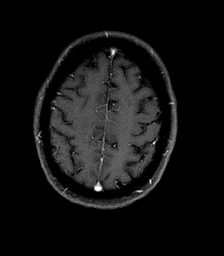
[im 17/19]
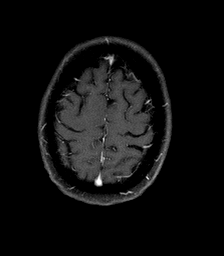
[im 18/19]
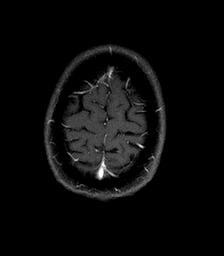
[im 19/19]
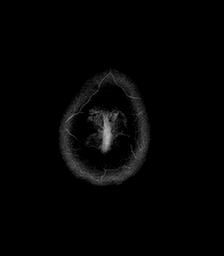

[Series 3: T1 fat-sat post-contrast · coronal · 5.0mm · 0.90mm/px · 24 of 24 slices shown (2 of 2)]
[im 1/24]
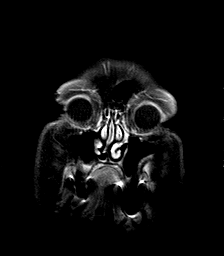
[im 2/24]
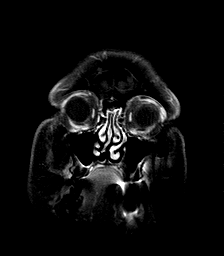
[im 3/24]
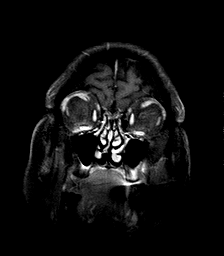
[im 4/24]
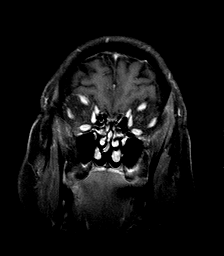
[im 5/24]
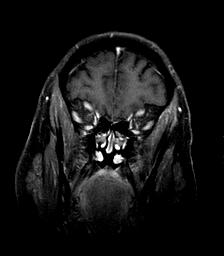
[im 6/24]
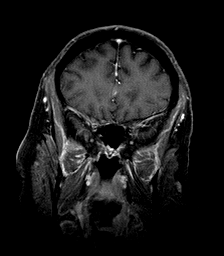
[im 7/24]
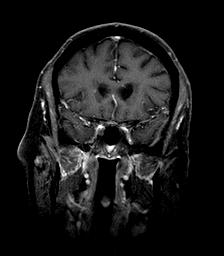
[im 8/24]
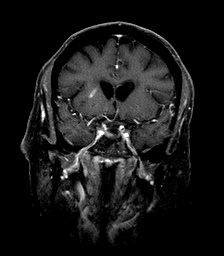
[im 9/24]
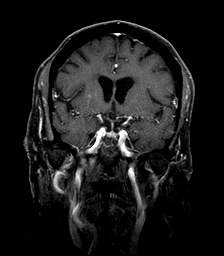
[im 10/24]
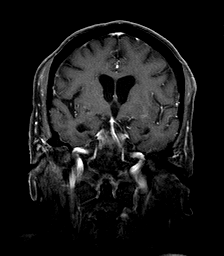
[im 11/24]
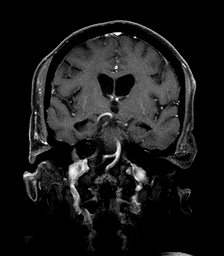
[im 12/24]
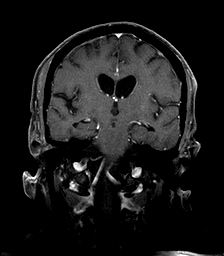
[im 13/24]
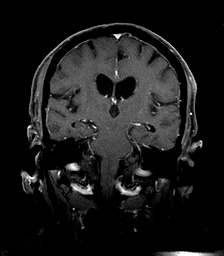
[im 14/24]
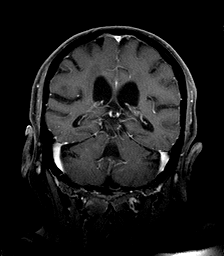
[im 15/24]
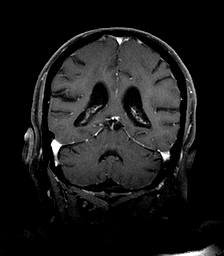
[im 16/24]
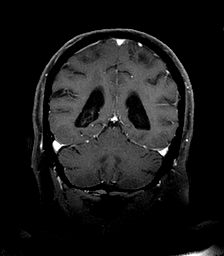
[im 17/24]
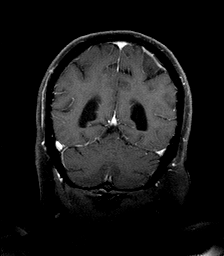
[im 18/24]
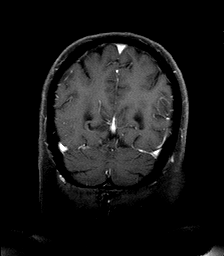
[im 19/24]
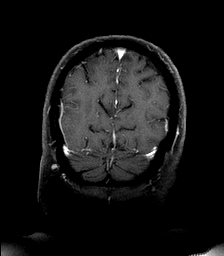
[im 20/24]
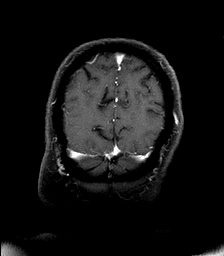
[im 21/24]
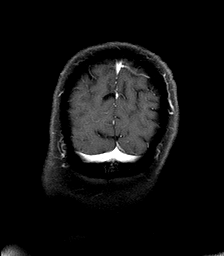
[im 22/24]
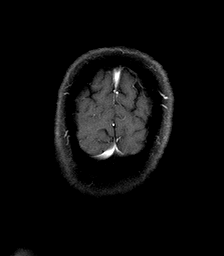
[im 23/24]
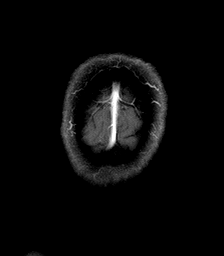
[im 24/24]
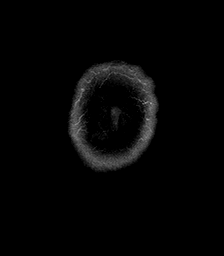

[43 of 43 positions shown; findings below may reference images not displayed]

FINDINGS: BRAIN:  Posterior fossa demonstrates NO evidence of cerebellar hemorrhage or mass.

NO evidence of supratentorial hemorrhage or mass.

No acute infarct. Chronic ischemic changes are NOT adequately demonstrated on enhanced T1 images.

VENTRICLES:  Stable, minimal enlargement of the ventricles.

Linear area of enhancement lateral to the anterior horn of the RIGHT lateral ventricle, best
demonstrated on image 13/24.

BONES/JOINTS:  No definite acute findings.

SINUSES:  Unremarkable as visualized.  No acute sinusitis.

MASTOID AIR CELLS:  Minimal patchy opacifications in the RIGHT mastoid air cells.

ORBITS:  Unremarkable as visualized.
IMPRESSION: - NO evidence of acute intracranial hemorrhage or mass.

- Minimal patchy opacifications in the RIGHT mastoid air cells.  NO change from prior study.

- Linear area of enhancement lateral to the anterior horn of the RIGHT lateral ventricle, best
demonstrated on image 13/24.  This is nonspecific. This may represent a venous anomaly. This is
typically considered incidental. NO specific follow-up recommended.

Tech Notes:

WEAKNESS, CONFUSION, PRESENTED TO ER YESTERDAY.  ABNORMAL CT.  15 ML GADAVIST.  RG

## 2019-10-29 IMAGING — US ECHOCOMPL
1 series · 12 of 24 positions shown · non-contrast
Comparison: none

[Series 1: us echo 2d, wo/w m-mode, compl · 80 acquisitions, 12 frames shown]
[im 4/80]
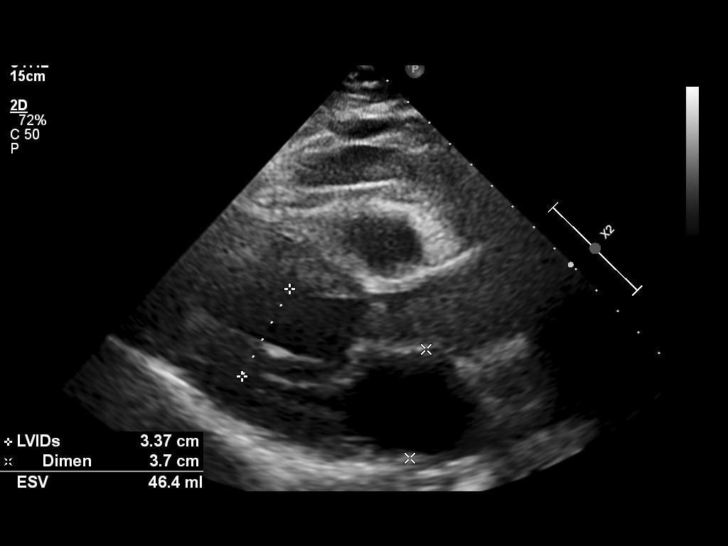
[im 11/80]
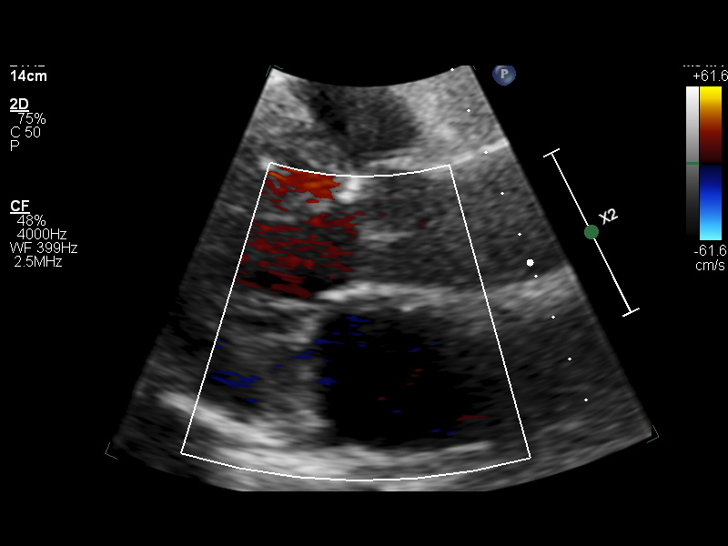
[im 21/80]
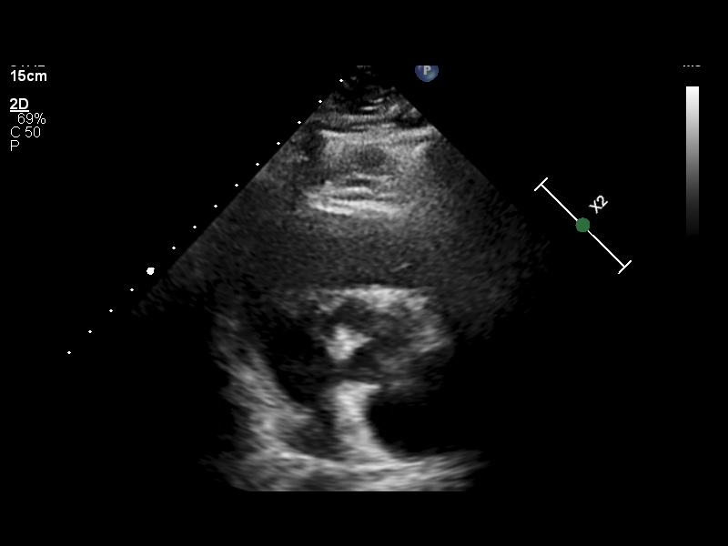
[im 25/80]
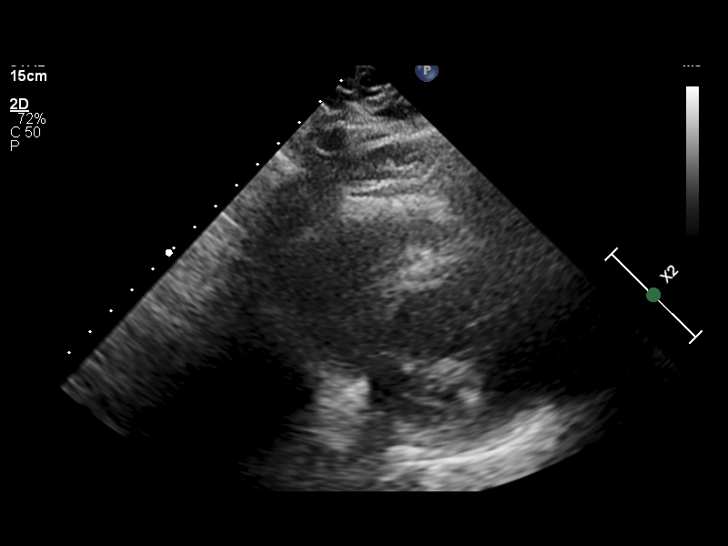
[im 31/80]
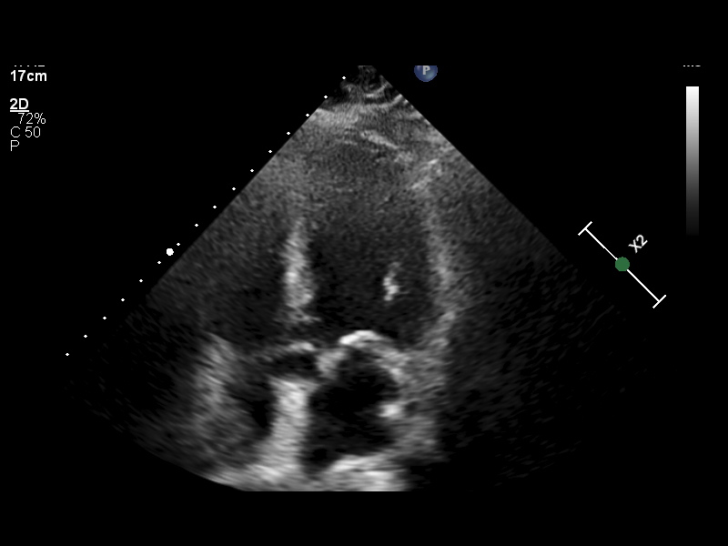
[im 38/80]
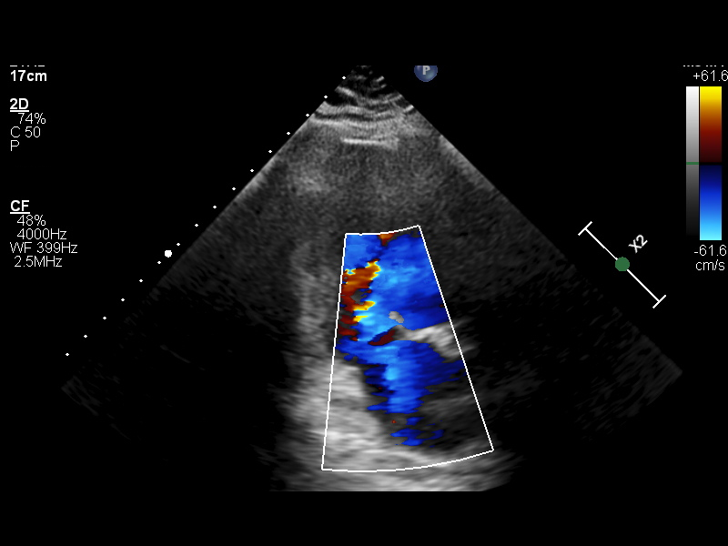
[im 45/80]
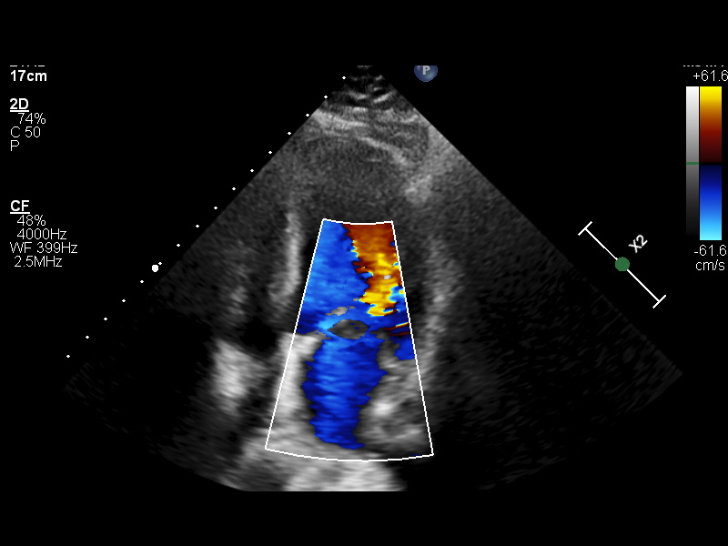
[im 52/80]
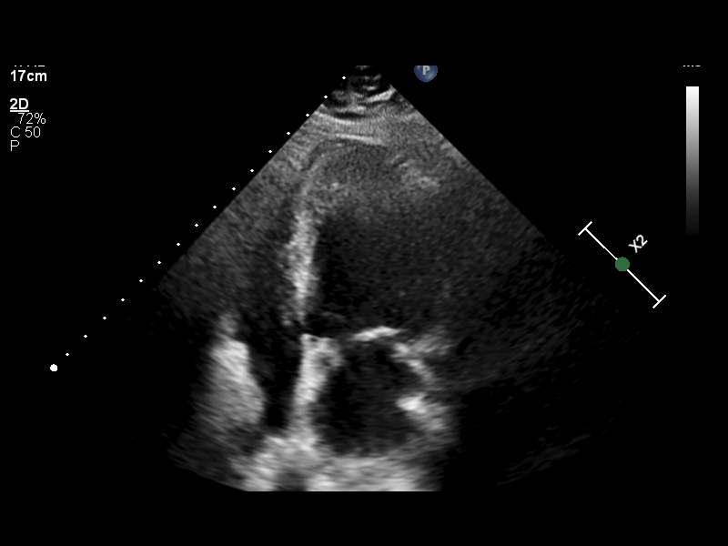
[im 55/80]
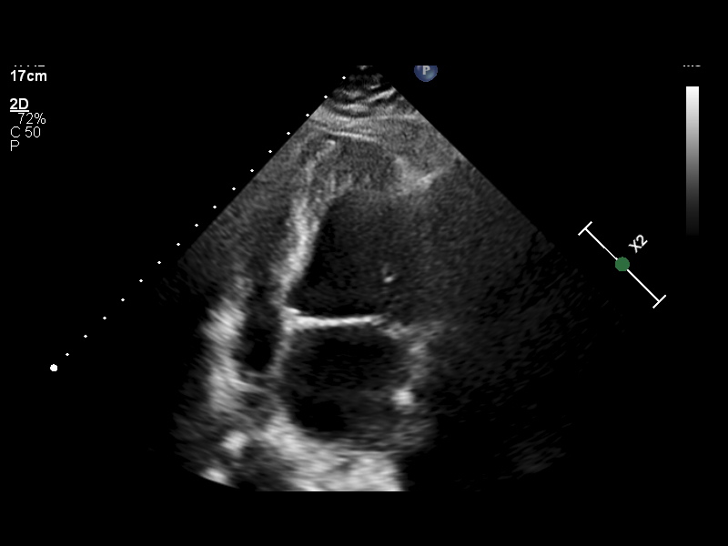
[im 69/80]
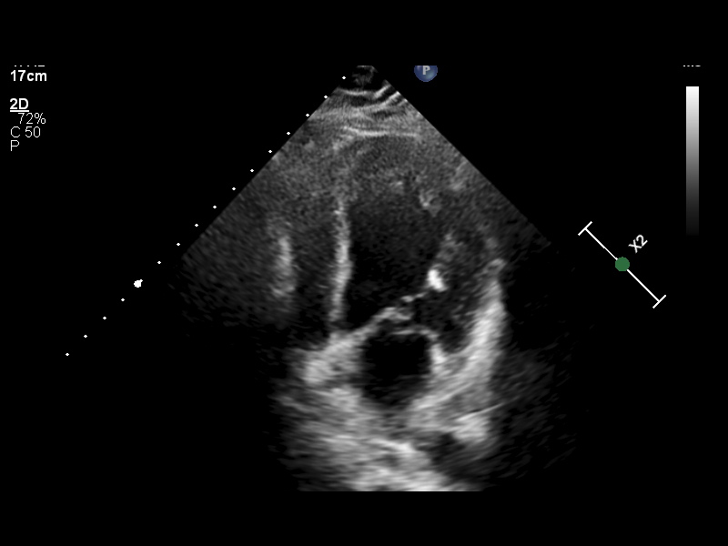
[im 73/80]
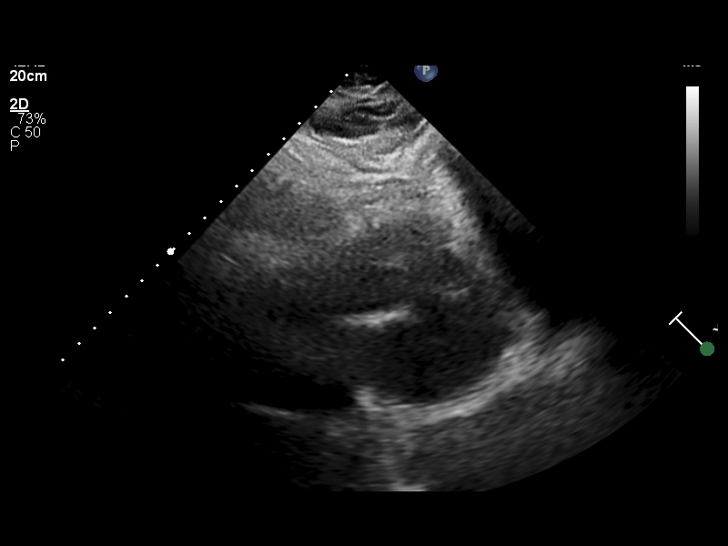
[im 80/80]
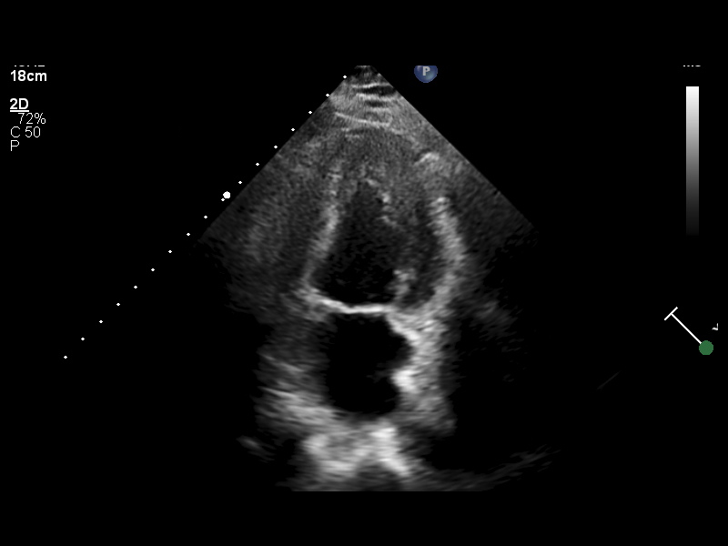

[12 of 24 positions shown; findings below may reference images not displayed]

10/29/19 -  2D + DOPPLER ECHO
Location Performed: [HOSPITAL]

Referring Provider: Irisaa Cun Serjoz, M.D.
Fellow:
Location of Interp: [facility]
Sonographer: External Staff
Machine:  [employer]
TM

Indications:           CVA vs. TIA

Saline contrast was given to evaluate for intracardiac shunt.

Vitals
Height   Weight   BSA (Calculated)   BP   Comments
157.5 cm (62")   98.4 kg (217 lb)   2.07   154/91

Interpretation Summary
1.  No cardiac source of embolism found.
2.  Normal left ventricular ejection fraction.  Mild diastolic dysfunction.
3.  No valve disease.
4.  Unable to measure pulmonary artery pressure.
5.  Normal right ventricular ejection fraction.
6.  No intracardiac shunt.

Echocardiographic Findings
Left Ventricle   The left ventricular size is normal. Concentric remodeling. The left ventricular
systolic function is normal. The visually estimated ejection fraction is 60%. The ejection fraction
by Simpson's biplane method is 62%. Grade I (mild) left ventricular diastolic dysfunction. Normal
left atrial pressure.
Right Ventricle   The right ventricular size, wall thickness and systolic function are normal.
Normal septal motion. The pulmonary artery pressure could not be estimated due to inadequate
tricuspid regurgitation signal.
Left Atrium   Mildly dilated. There is no interatrial shunting by color flow Doppler and saline
contrast studies.
Right Atrium   Normal size.
IVC/SVC   Elevated central venous pressure (5-10 mm Hg).
Mitral Valve   Normal valve structure. No stenosis. No regurgitation.
Tricuspid Valve   Normal valve structure. No stenosis. No regurgitation.
Aortic Valve   Normal valve structure. No stenosis. No regurgitation.
Pulmonary   The pulmonic valve was not well seen. No stenosis. No regurgitation.
The pulmonary artery was not well seen.
Aorta   The ascending aorta is mildly dilated.
Pericardium   No pericardial effusion.

Left Ventricular Wall Scoring
Resting   Score Index: 1.000   Percent Normal: 100.0%

The following segments are normal: basal anterior, basal anteroseptal, basal inferoseptal, basal
inferior, basal inferolateral, basal anterolateral, mid anterior, mid anteroseptal, mid
inferoseptal, mid inferior, mid inferolateral, mid anterolateral, apical anterior, apical septal,
apical inferior and apical lateral.
Other segments could not be evaluated.

Left Heart 2D Measurements (Normal Ranges)
EF (Visual)
60 %
EF (Simpson's)
62 %
LVIDD
4.4 cm (Range: 3.8 - 5.2)
LVIDS
3.4 cm (Range: 2.2 - 3.5)
IVS
1.1 cm (Range: 0.6 - 0.9)
LV PW
1.1 cm (Range: 0.6 - 0.9)
LA Size
3.7 cm (Range: 2.7 - 3.8)

Right Heart 2D   M-Mode Measurements (Normal Ranges) (Range)
RV Basal Dia
2.8 cm (2.5 - 4.1)
RV Mid Dia
3.2 cm (1.9 - 3.5)
JINAA
9.2 cm2 (<18)
M-Mode TAPSE
1.7 cm (>1.7)

Left Heart 2D Addnl Measurements (Normal Ranges)
LV Systolic Vol
30 mL (Range: 14 - 42)
LV Systolic Vol Index
14 mL (Range: 8 - 24)
LV Diastolic Vol
78 mL (Range: 46 - 106)
LV Diastolic Vol Index
38 mL (Range: 29 - 61)
LA Vol
62 mL (Range: 22 - 52)
LA Vol Index
29.95 (Range: 16 - 34)
LV Mass
169 g (Range: 67 - 162)
LV Mass Index
82 g/m2 (Range: 43 - 95)
RWT
0.50 (Range: <=0.42)

Aortic Root Measurements (Normal Ranges)
Sinus
3.1 cm (Range: 2.4 - 3.6)
RONALDIL MARGUSON
3.2 cm

Doppler (Spectral and Color Flow)
Aortic valve peak velocity
1.3 m/s

Tech Notes:

CVA VS TIA

## 2021-04-01 IMAGING — CT HEADWO
3 series · 14 of 47 positions shown, 16 images · non-contrast
Comparison: 10/29/2019 MRI.

DIAGNOSTIC STUDIES

EXAM:  CT HEAD WITHOUT INTRAVENOUS CONTRAST  ([id number])
INDICATION: acute homonymous hemianopia pt states feels like glass behind eyes, stroke, ct/nm 0/0.
kf/cf
TECHNIQUE: Axial computed tomography images of the head/brain without intravenous contrast.
Sagittal and coronal reformatted images were created and reviewed.

[Series 5: brain cor 5.00 hr40 s3 · coronal · 0.30mm/px · 3 of 18 slices shown]
[im 6/18  brain]
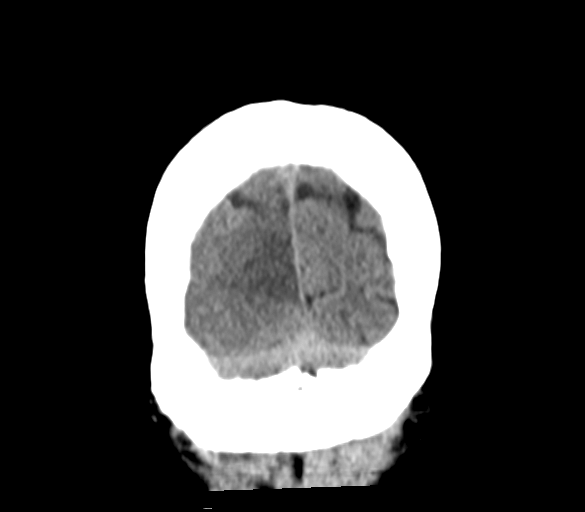
[im 8/18  brain]
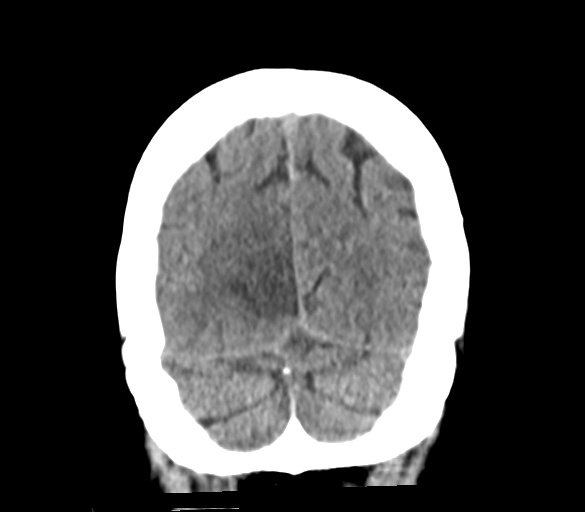
[im 10/18  brain]
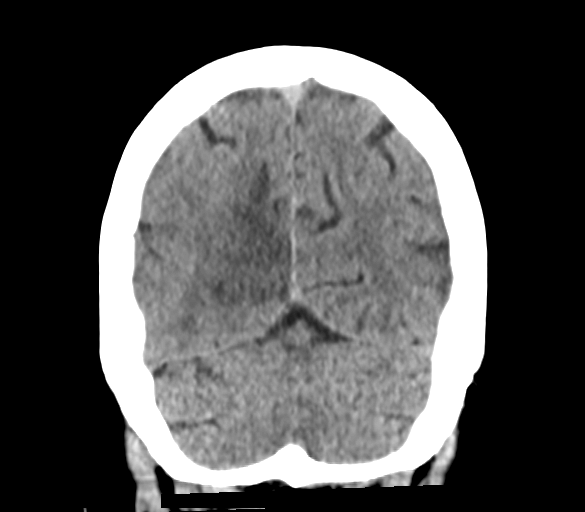

[Series 7: brain sag 5.00 hr40 s3 · sagittal · 0.30mm/px · 3 of 35 slices shown]
[im 12/35  brain]
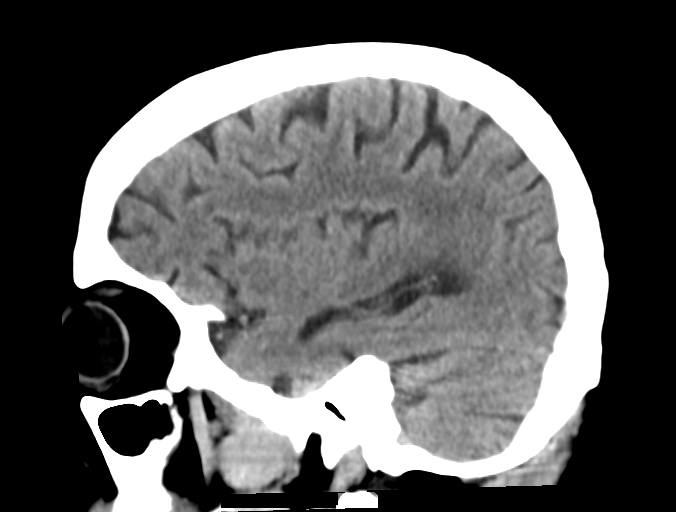
[im 18/35  brain]
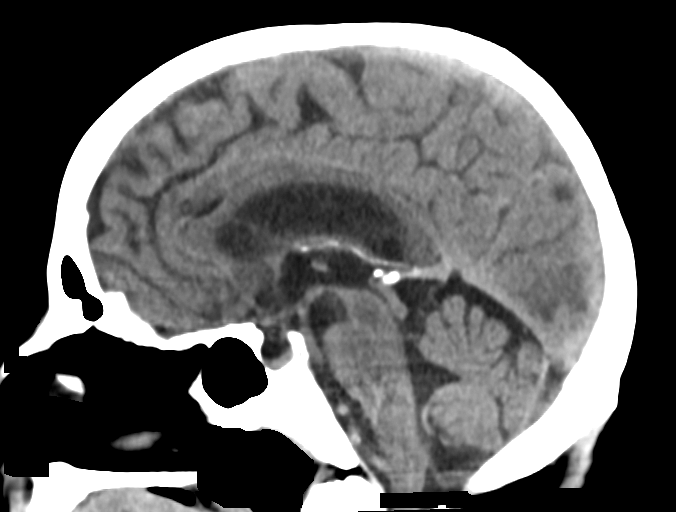
[im 23/35  brain]
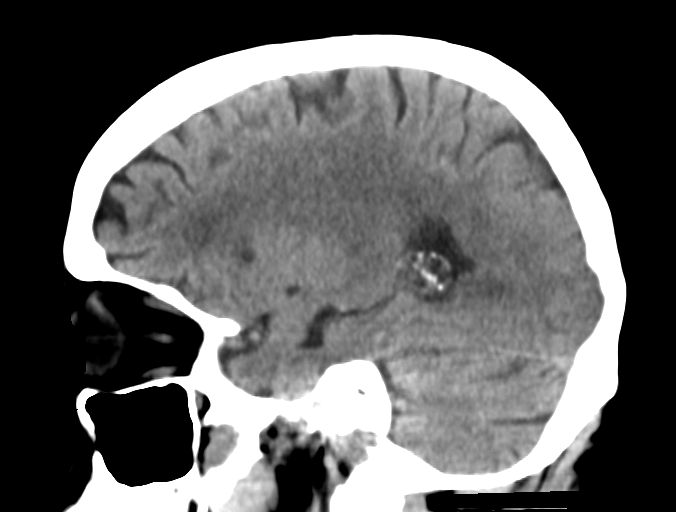

[Series 9: brain ax 2.00 hr60 s3 · axial · 0.35mm/px · z∈[-575,-446]mm · 8 of 75 slices shown, 10 images]
[im 6/75  brain]
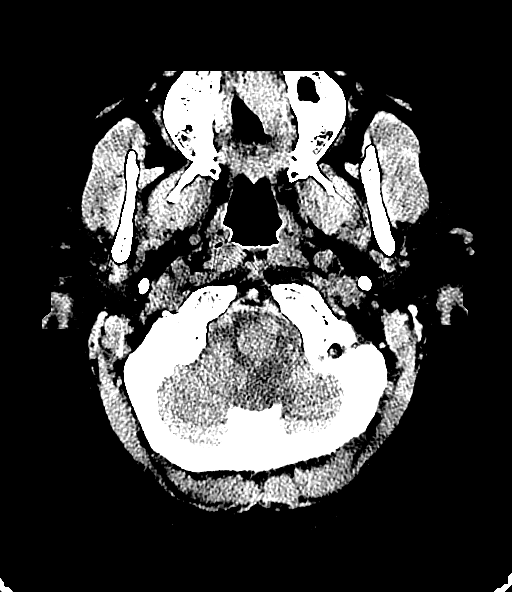
[im 6/75  bone]
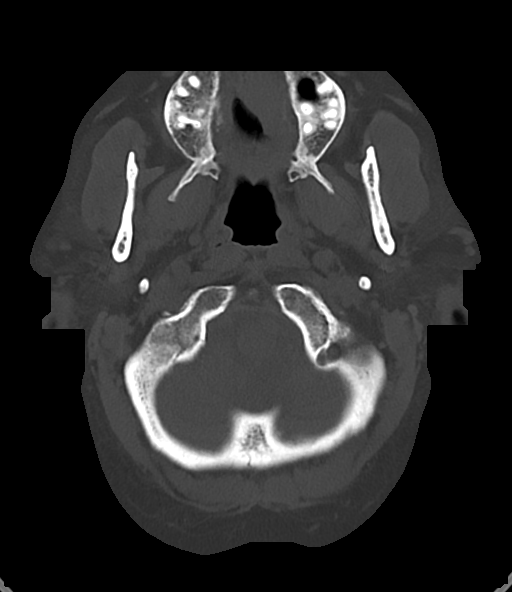
[im 16/75  brain]
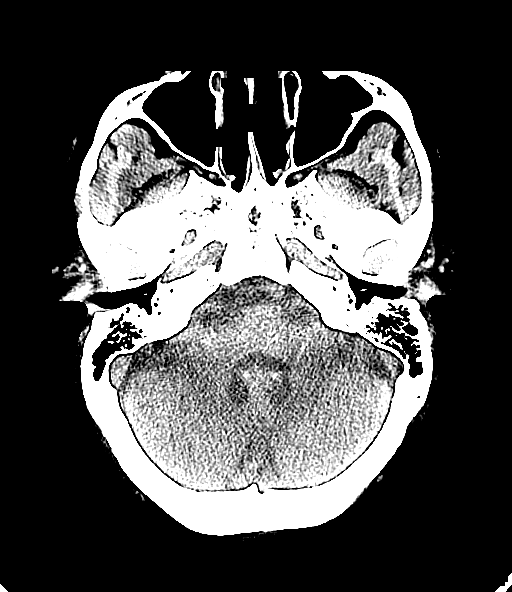
[im 23/75  brain]
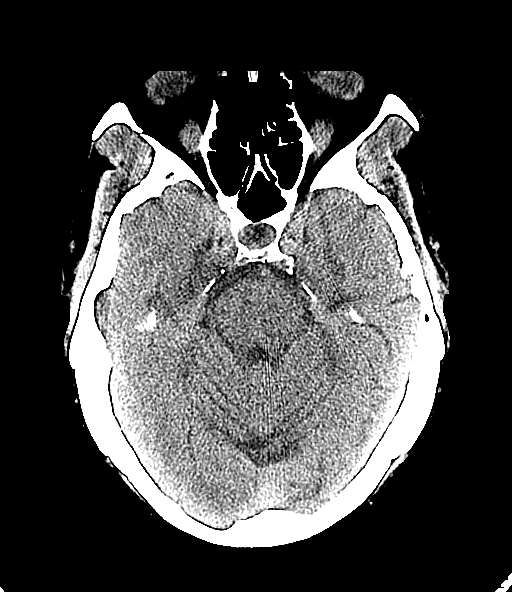
[im 34/75  brain]
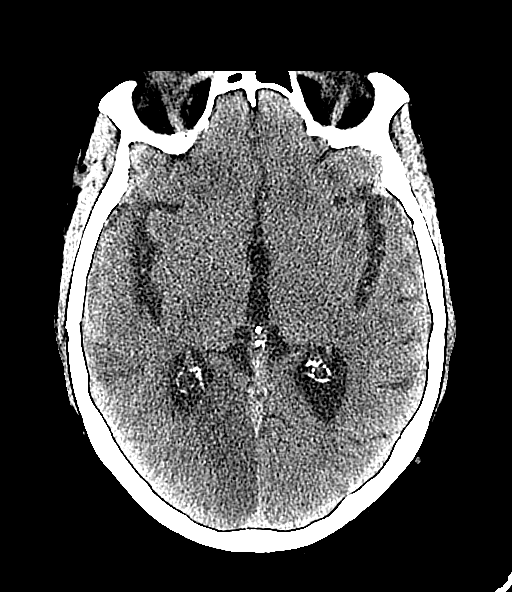
[im 41/75  brain]
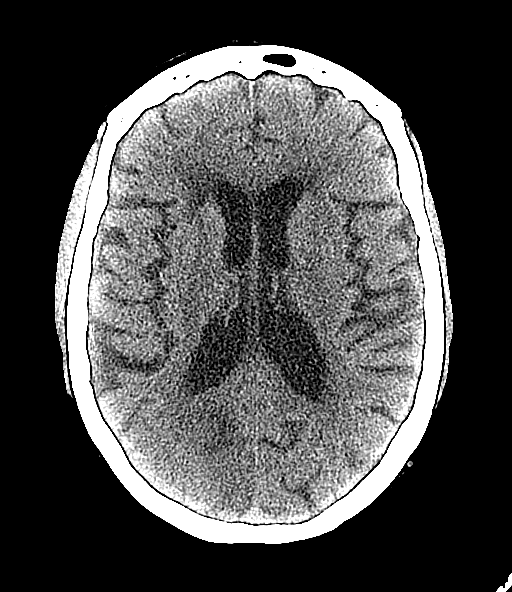
[im 41/75  bone]
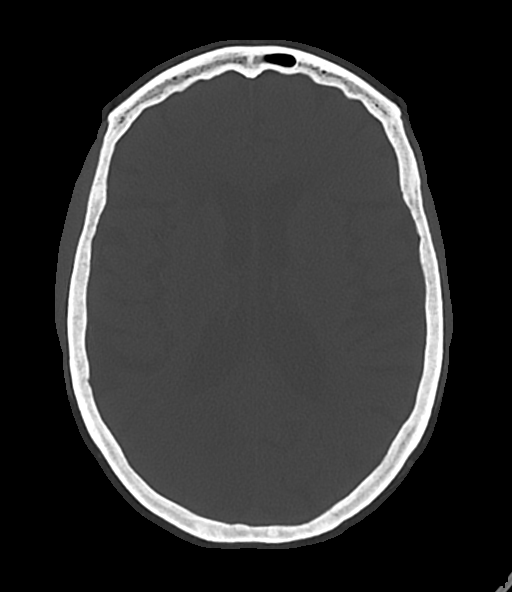
[im 52/75  brain]
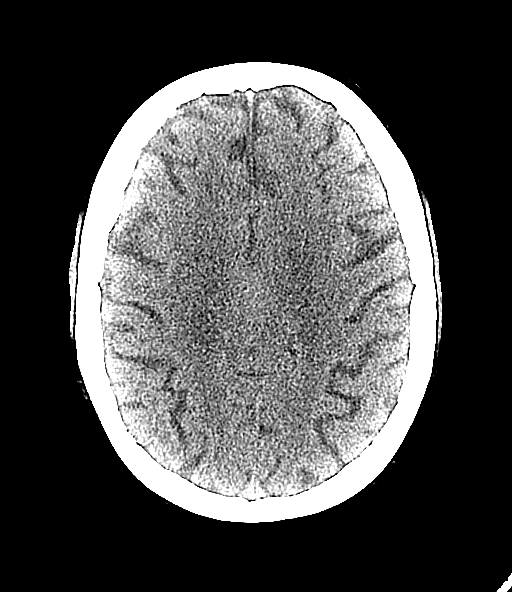
[im 59/75  brain]
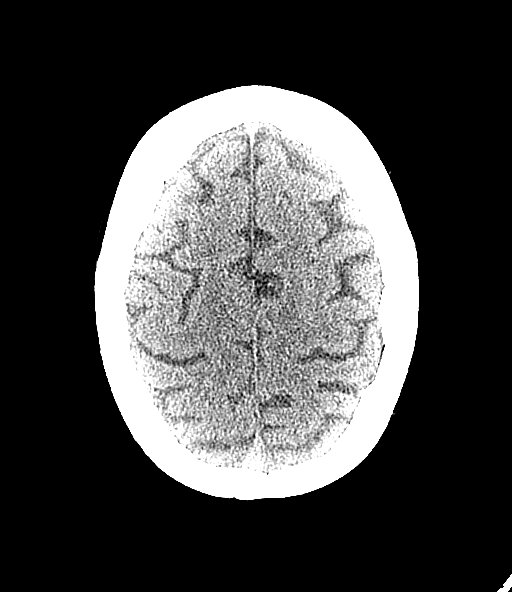
[im 69/75  brain]
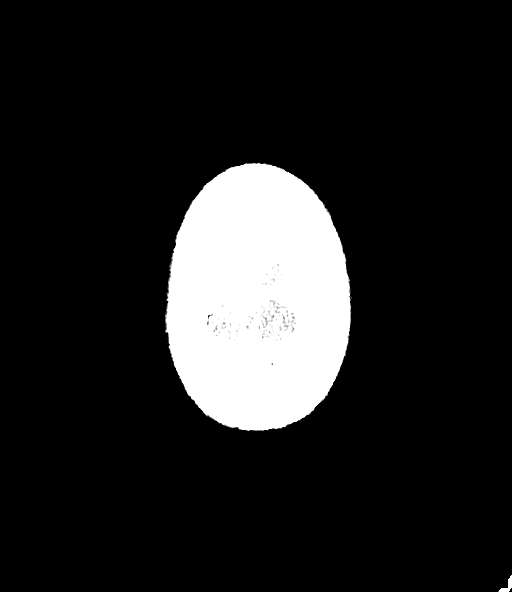

[14 of 47 positions shown; findings below may reference images not displayed]

FINDINGS: BRAIN:  ABNORMAL FINDING, FURTHER EVALUATION RECOMMENDED:  Large area of decreased attenuation in
the medial RIGHT occipital lobe and extending superiorly to the parieto-occipital area medially.

Areas of ischemia in the frontal lobes bilaterally, right greater than left.

NO areas of acute hemorrhage. NO mass.

VENTRICLES:  The cerebral ventricles are normal in size.

BONES/JOINTS:  Calvarium and skull base demonstrate NO acute changes.

SOFT TISSUES:  Soft tissues demonstrate NO fluid collections or foreign body.

VASCULATURE:  Mild arterial vascular calcification.

SINUSES:  The paranasal sinuses demonstrate NO significant opacifications or air-fluid levels.

MASTOID AIR CELLS:  The mastoid air cells demonstrate no significant opacification.
IMPRESSION: - ABNORMAL FINDING, FURTHER EVALUATION RECOMMENDED:  Large area of decreased attenuation in the
medial RIGHT occipital lobe and extending superiorly to the parieto-occipital area medially.  C
onsistent with ischemia. This appears to be subacute. If indicated, follow-up should be considered
with MRI.

- Areas of ischemia in the frontal lobes bilaterally, right greater than left.  The age is
indeterminate; however this appears chronic. This is new from the previous study; however.

- NO areas of acute hemorrhage.

04/01/2021 [DATE] by Wanchai Doha.

Tech Notes:

pt states feels like glass behind eyes, stroke, ct/nm 0/0. kf/cf

## 2021-06-17 IMAGING — MR Head^Brain
8 series · 41 of 48 positions shown · non-contrast
Comparison: none

[Series 2: T1 · sagittal · 5.0mm · 0.45mm/px · 4 of 19 slices shown (1 of 2)]
[im 1/19]
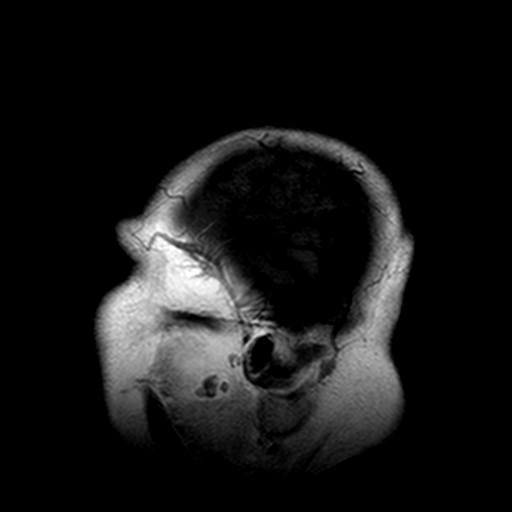
[im 7/19]
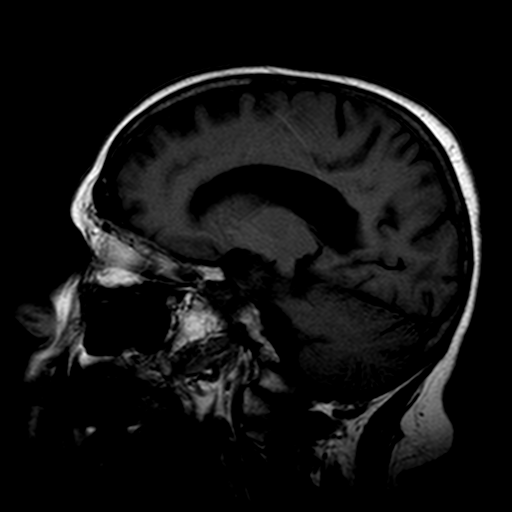
[im 13/19]
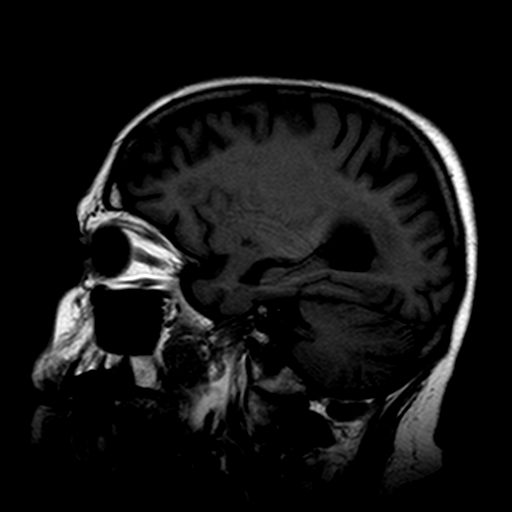
[im 19/19]
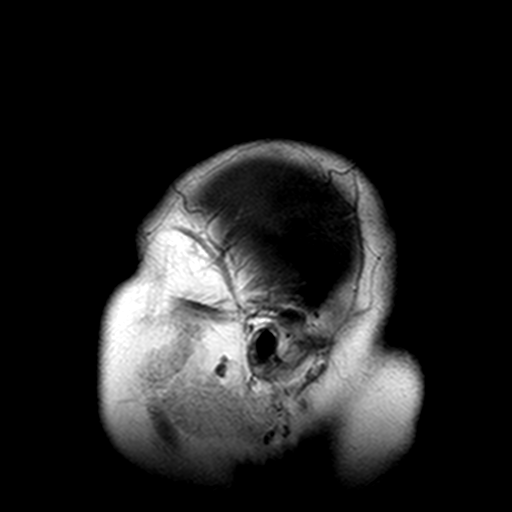

[Series 3: DWI · axial · 5.0mm · 1.80mm/px · z∈[-58,+66]mm · 9 of 58 slices shown (1 of 2)]
[im 1/58]
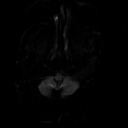
[im 10/58]
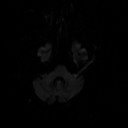
[im 20/58]
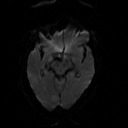
[im 24/58]
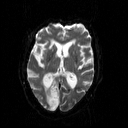
[im 29/58]
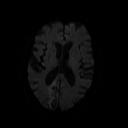
[im 34/58]
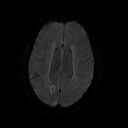
[im 39/58]
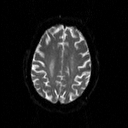
[im 48/58]
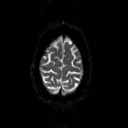
[im 58/58]
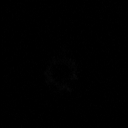

[Series 4: DWI · axial · 5.0mm · 1.80mm/px · z∈[-58,+66]mm · 5 of 21 slices shown (2 of 2)]
[im 1/21]
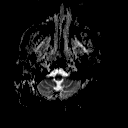
[im 6/21]
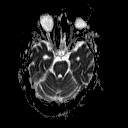
[im 11/21]
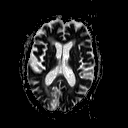
[im 16/21]
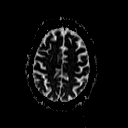
[im 21/21]
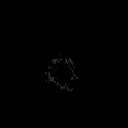

[Series 5: FLAIR · axial · 5.0mm · 0.45mm/px · z∈[-75,+69]mm · 5 of 24 slices shown]
[im 1/24]
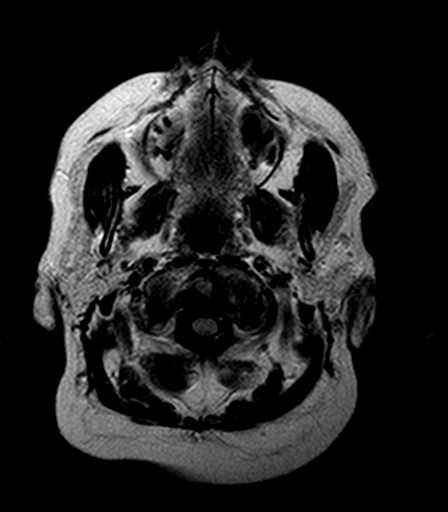
[im 6/24]
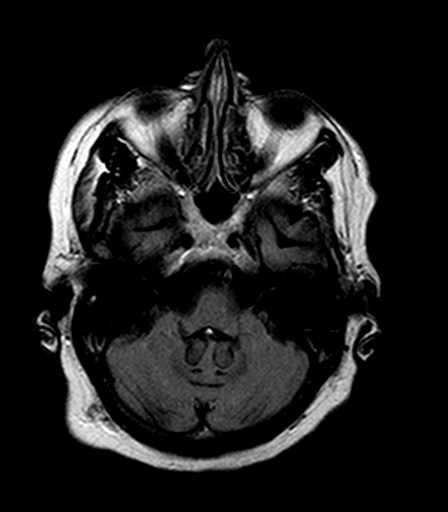
[im 12/24]
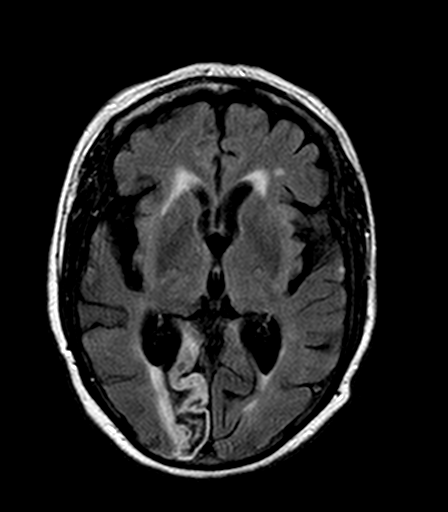
[im 18/24]
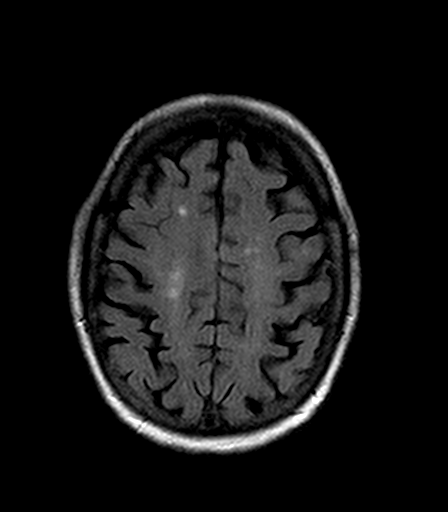
[im 24/24]
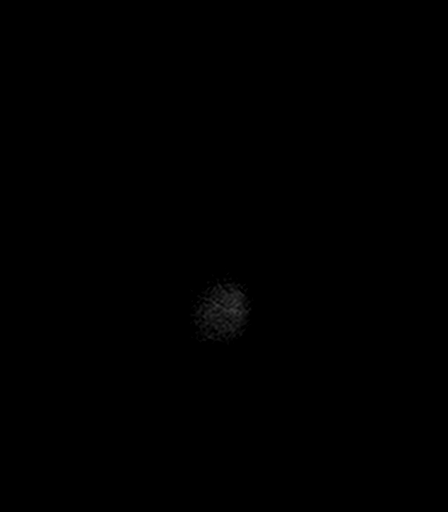

[Series 6: T2 · axial · 5.0mm · 0.72mm/px · z∈[-75,+69]mm · 5 of 24 slices shown (1 of 2)]
[im 1/24]
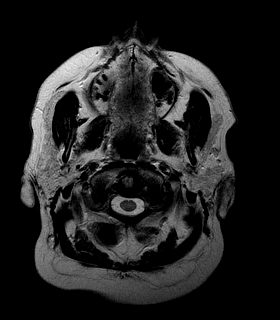
[im 6/24]
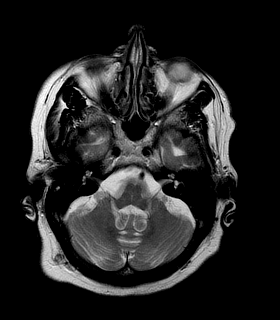
[im 12/24]
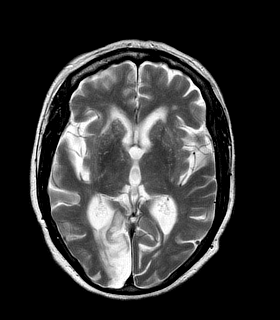
[im 18/24]
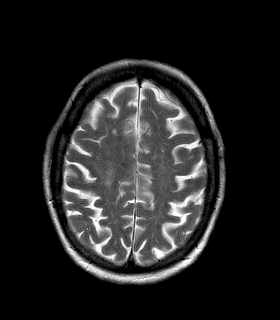
[im 24/24]
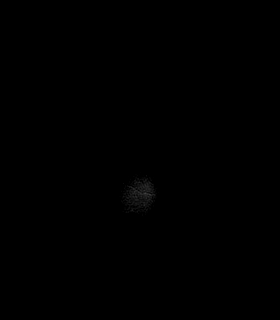

[Series 7: T1 · axial · 5.0mm · 0.45mm/px · z∈[-75,+69]mm · 5 of 24 slices shown (2 of 2)]
[im 1/24]
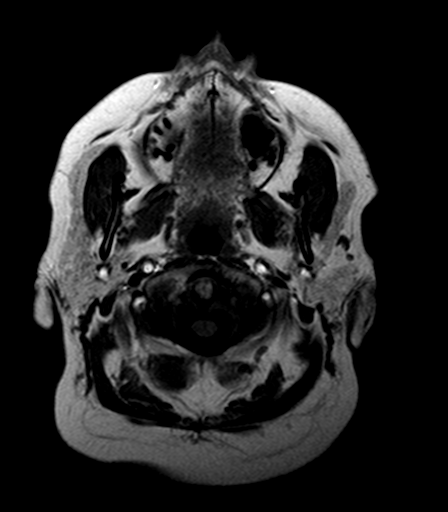
[im 6/24]
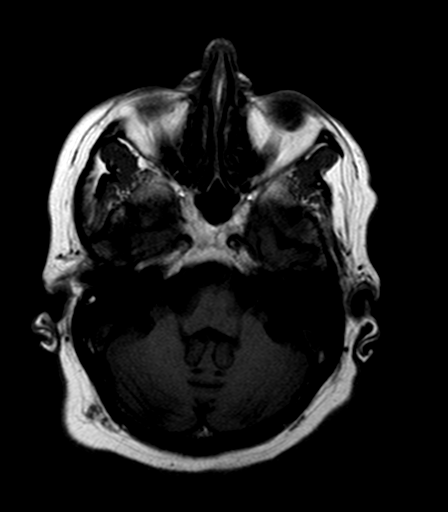
[im 12/24]
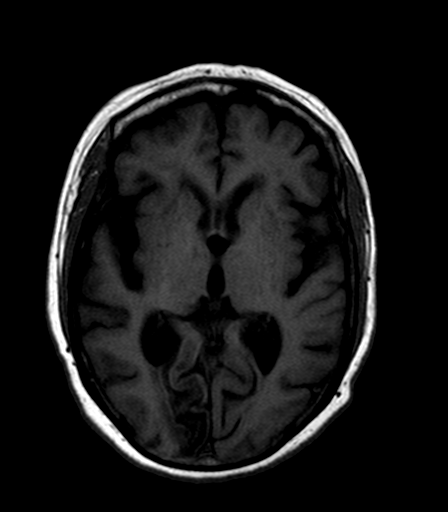
[im 18/24]
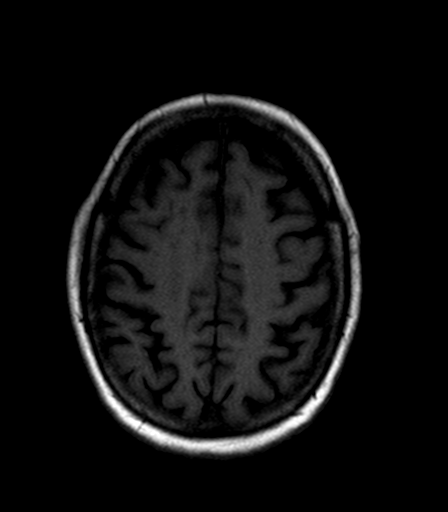
[im 24/24]
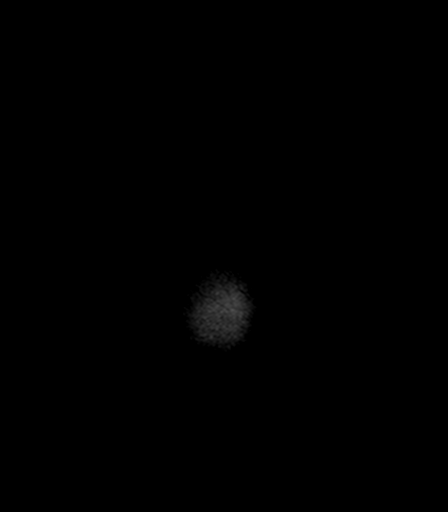

[Series 8: axial blood · axial · 5.0mm · 0.45mm/px · z∈[-75,-44]mm · 2 of 24 slices shown]
[im 1/24]
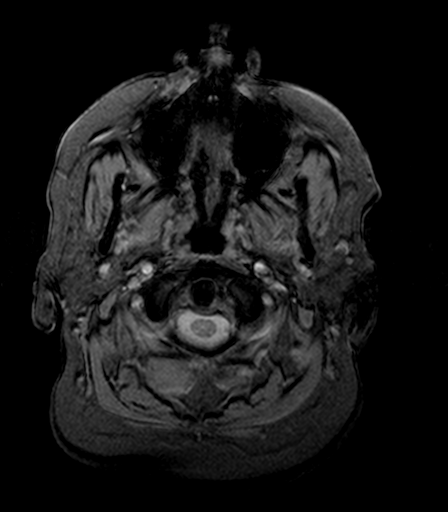
[im 6/24]
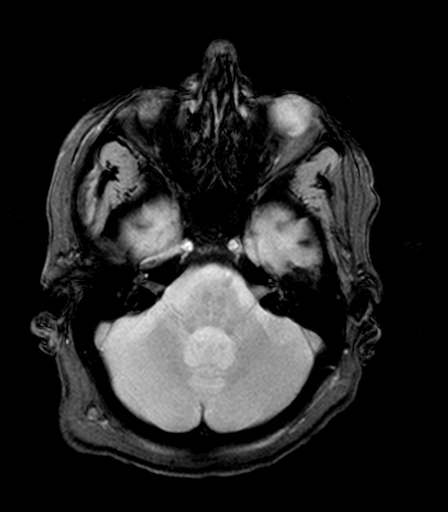

[Series 9: T2 · coronal · 5.0mm · 0.69mm/px · 6 of 26 slices shown (2 of 2)]
[im 1/26]
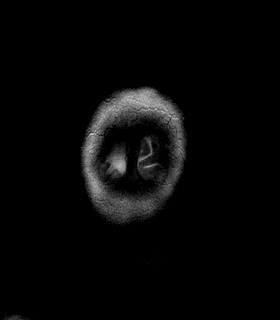
[im 6/26]
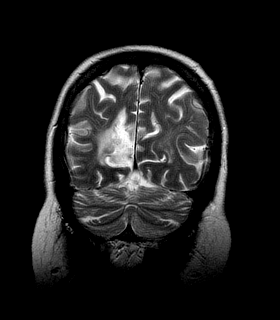
[im 11/26]
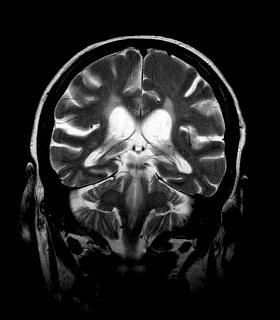
[im 16/26]
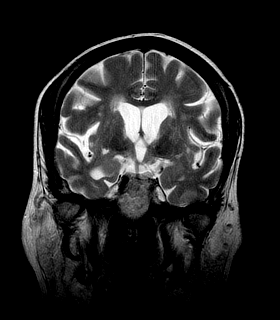
[im 21/26]
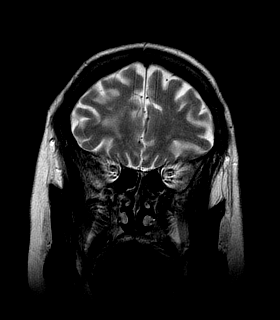
[im 26/26]
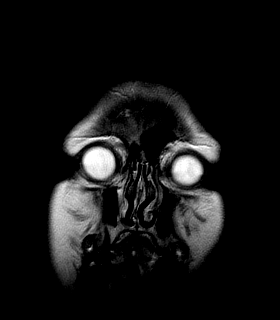

[41 of 48 positions shown; findings below may reference images not displayed]

EXAM

MR head/brain wo con

INDICATION

Stroke,visual loss,neuropathy,unsteady gait
VISION LOSS, UNSTEADY GAIT, HX CVA. RG

TECHNIQUE

Multiplanar multisequence MRI of the brain without IV contrast

COMPARISONS

October 29, 2019,April 01, 2021

FINDINGS

Late subacute infarct along the right occipital lobe is re-demonstrated with mild peripheral
residual restricted diffusion. No evidence of acute intracranial hemorrhage. Moderate chronic
microvascular disease and parenchymal volume loss.

Corpus callosum, pituitary, and pineal gland are unremarkable.

IMPRESSION

Sequela of a right occipital infarct. No acute findings.

Moderate chronic microvascular disease and parenchymal volume loss.

Tech Notes:

VISION LOSS, UNSTEADY GAIT, HX CVA. RG

## 2023-01-08 ENCOUNTER — Encounter: Admit: 2023-01-08 | Discharge: 2023-01-08 | Payer: MEDICARE

## 2023-01-09 ENCOUNTER — Encounter: Admit: 2023-01-09 | Discharge: 2023-01-09 | Payer: MEDICARE

## 2023-01-11 ENCOUNTER — Encounter: Admit: 2023-01-11 | Discharge: 2023-01-11 | Payer: MEDICARE

## 2023-01-11 NOTE — Progress Notes
Appointment Notes: Scheduled 01/29/23 @ 10:00 with Dr. Jodi Geralds. PMB, negative path on HSD&C. Patient desires hysterectomy    Referring Provider: Gayleen Orem, MD    Location of Films:   N/A    Location of Pathology:  N/A    Contact Summary: Patient seen by GYN on 11/10/22 for worsening PMB.     She had an EMB 03/29/22 and pathology showed:   A:  Uterus, endometrium, biopsy:   -RARE STRIPS OF BENIGN ENDOCERVICAL EPITHELIUM IN A BACKGROUND OF ABUNDANT MUCOID MATERIAL.  -  NO ENDOMETRIAL EPITHELIUM PRESENT FOR EVALUATION.      On 11/23/22 patient underwent a hysteroscopy,  D&C.    Pathology showed:  A:  Endometrial curettings:   -POLYPOID FRAGMENTS OF BENIGN ENDOCERVICAL TISSUE.  -  NO ATYPIA, DYSPLASIA OR MALIGNANCY.   -See comment.     COMMENT:    The material consists predominantly of polypoid fragments of benign endocervical tissue.  No endometrial tissue is present for endometrial evaluation.     Patient is desiring a hysterectomy with GYN ONC, if she is found to have endometrial cancer.         Additional records can be found in Care Everywhere.     Allergies reviewed and verified with the patient, and documented in Epic:  Yes    Surgical Hx   Surgery/Year: Hyatereoscopy D&C 11/23/22, EMB 10/18/23C-section 1984 and 88    Reproductive History  Menstrual Hx   LMP: Does not know   Having Periods:  No   Age at first period: Does not know    Pregnancy Hx   Number of pregnancies: 3   Number of live births: 3   Age of first live birth: 77 y/o    Did you breastfeed: Yes    If Yes, how long? All 3 for at least a year each   Oral Birth Control:  No   Years:    Infertility Medication:  No   Year/Med Name:     Menopausal Hx   Age of last period: Not sure   Hormone Replacement Therapy:  Yes   Years: 1 year        Health Maintenence  Last Pap: Not sure  Abn History of Pap: Does not know    Colonoscopy: Yes, around age 12 y/o    Mammogram: Yes, around age 16-57 y/o    Bone scan: Not sure    DPOA: Yes, Anselmo Pickler (daughter) [phone]    Living Will: Yes      Problem List:   There are no problems to display for this patient.      History:  No family history on file.  No past medical history on file.  No past surgical history on file.      Physical:  Fall Risk: Impaired balance/mobility;Use of assistive device   Patient Education  Education provided to: patient's child  Preferred method: oral instruction  Are learners ready to learn?: Yes  Are there barriers to learning?: No     Phase of patient's treatment: Pre-Treatment    Topics Discussed  Topics discussed: Appointment details including date, time, location, parking, and check-in procedures;Orientation to [facility];Procedures/surgery;Treatment options     Ph # given to patient for follow up: Yes      Education Details  Educated by: telephone     Learner's response: The patient expressed understanding of what was explained to them, participated and agreed with the present plan.;The patient has received contact information and was instructed on  how to contact us if questions or concerns arise  Materials given to patient: Appointment Guide

## 2023-01-29 ENCOUNTER — Encounter: Admit: 2023-01-29 | Discharge: 2023-01-29 | Payer: MEDICARE

## 2023-01-29 VITALS — BP 133/82 | HR 67 | Temp 97.80000°F | Resp 16 | Ht 62.165 in | Wt 200.0 lb

## 2023-01-29 DIAGNOSIS — I10 Essential (primary) hypertension: Secondary | ICD-10-CM

## 2023-01-29 DIAGNOSIS — D219 Benign neoplasm of connective and other soft tissue, unspecified: Secondary | ICD-10-CM

## 2023-01-29 DIAGNOSIS — E119 Type 2 diabetes mellitus without complications: Secondary | ICD-10-CM

## 2023-01-29 DIAGNOSIS — G459 Transient cerebral ischemic attack, unspecified: Secondary | ICD-10-CM

## 2023-01-29 DIAGNOSIS — N950 Postmenopausal bleeding: Principal | ICD-10-CM

## 2023-01-29 NOTE — Progress Notes
Subjective     GYNECOLOGIC ONCOLOGY EVALUATION    Name:Heather Meadows    Date: 01/29/2023    Referring Physician:     Primary Care Physician: Heather Meadows    Chief Complaint:   Chief Complaint   Patient presents with    New CA Pt       History of Present Illness:    Heather Meadows is a 77 y.o. 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    She also has a history of diabetes, COPD, and memory issues. The bleeding ranges from spotting to like a heavy menses. The bleeding is sometimes accompanied by back pain but no other associated symptoms. Heather Meadows's daughter, Heather Meadows, who is involved in her care, reports that Heather Meadows's incontinence improved after a previous D&C procedure but has worsened again. She describes it has insensible urine loss. Heather Meadows's diabetes, which was previously uncontrolled due to non-compliance with medications due to memory issues, is now being regulated with the help of her daughter. Heather Meadows levels are being monitored, and she has an upcoming appointment with an endocrinologist in September to check again, last level in 05/2022 was 8. Heather Meadows also has COPD, but it does not seem to significantly impact her daily activities. Heather Meadows and her sister are working to get Heather Meadows to a memory clinic as she has had significant cognitive decline. She looks to Heather Meadows to make decisions and understand discussion during this visit.                      Onc Timeline    No history exists.       Reproductive History  Menstrual Hx               LMP: Does not know               Having Periods:  No               Age at first period: Does not know     Pregnancy Hx               Number of pregnancies: 3               Number of live births: 3               Age of first live birth: 77 y/o                Did you breastfeed: Yes                           If Yes, how long? All 3 for at least a year each               Oral Birth Control:  No   Years:                Infertility Medication:  No               Year/Med Name:      Menopausal Hx               Age of last period: Not sure               Hormone Replacement Therapy:  Yes               Years: 1 year  Health Maintenence  Last Pap: Not sure  Abn History of Pap: Does not know     Colonoscopy: Yes, around age 49 y/o     Mammogram: Yes, around age 77-57 y/o     Bone scan: Not sure     DPOA: Yes, Heather Meadows (daughter) [phone]     Living Will: Yes    Past Medical History:  Past Medical History:   Diagnosis Date    Diabetes mellitus (HCC)     Fibroids     Hypertension     TIA (transient ischemic attack)        Past Surgical History:  Surgical History:   Procedure Laterality Date    FRACTURE SURGERY      PR LAPAROSCOPY SURG RPR INITIAL INGUINAL HERNIA         Medications:    Current Outpatient Medications:     acetaminophen SR (TYLENOL 8 HOUR) 650 mg tablet, Take one tablet by mouth every 8 hours as needed for Pain., Disp: , Rfl:     aspirin EC (ASPIR-LOW) 81 mg tablet, Take one tablet by mouth daily., Disp: , Rfl:     atorvastatin (LIPITOR) 40 mg tablet, Take one tablet by mouth daily., Disp: , Rfl:     cetirizine (ZYRTEC) 10 mg tablet, Take one tablet by mouth daily., Disp: , Rfl:     clopiDOGreL (PLAVIX) 75 mg tablet, Take one tablet by mouth., Disp: , Rfl:     DOCOSAHEXAENOIC ACID PO, Take 500 mg by mouth daily., Disp: , Rfl:     ezetimibe (ZETIA) 10 mg tablet, Take one tablet by mouth daily., Disp: , Rfl:     FARXIGA 10 mg tablet, Take one tablet by mouth every morning., Disp: , Rfl:     FLUoxetine (PROZAC) 20 mg capsule, Take one capsule by mouth daily., Disp: , Rfl:     insulin aspart (U-100) (NOVOLOG FLEXPEN U-100 INSULIN) 100 unit/mL (3 mL) PEN, Inject fourteen Units under the skin., Disp: , Rfl:     insulin glargine (LANTUS SOLOSTAR U-100 INSULIN) 100 unit/mL (3 mL) subcutaneous PEN, Inject forty Units under the skin., Disp: , Rfl:     LORazepam (ATIVAN) 0.5 mg tablet, Take one tablet by mouth three times daily as needed., Disp: , Rfl:     melatonin (MELATIN) 3 mg tablet, Take two tablets by mouth daily as needed., Disp: , Rfl:     metFORMIN (GLUCOPHAGE) 1,000 mg tablet, Take one tablet by mouth twice daily., Disp: , Rfl:     metoprolol succinate XL (TOPROL XL) 100 mg extended release tablet, Take one tablet by mouth daily., Disp: , Rfl:     Multivitamins-Ca-Iron-Minerals 27-0.4 mg tab, Take 1 tablet by mouth daily., Disp: , Rfl:     rivastigmine (EXELON) 4.6 mg/24 hour transdermal patch, one patch., Disp: , Rfl:     Allergies:  Allergies   Allergen Reactions    Erythromycin RASH and SHORTNESS OF BREATH    Azithromycin RASH    Bee/C HIVES     Other reaction(s): swelling    Lincomycin RASH    Penicillins RASH       Social History:  Social History     Socioeconomic History    Marital status: Widowed   Tobacco Use    Smoking status: Former     Types: Cigarettes    Smokeless tobacco: Never   Substance and Sexual Activity    Alcohol use: Yes    Drug use: Yes     Types: Marijuana  Family History:  No family history on file.      REVIEW OF SYSTEMS:        CONSTITUTIONAL: Negative unless stated in HPI  EYES: Negative unless stated in HPI  ENT: Negative unless stated in HPI  RESPIRATORY: Negative unless stated in HPI  CARDIOVASCULAR: Negative unless stated in HPI  GI: Negative unless stated in HPI  GU: Negative unless stated in HPI  MUSCULO-SKELETAL: Negative unless stated in HPI  SKIN: Negative unless stated in HPI  ENDOCRINE: Negative unless stated in HPI  HEMATOLOGIC: Negative unless stated in HPI    ECOG 1     Physical Exam:    BP 133/82 (BP Source: Arm, Left Upper, Patient Position: Sitting)  - Pulse 67  - Temp 36.6 ?C (97.8 ?F) (Oral)  - Resp 16  - Ht 157.9 cm (5' 2.17) Comment: in shoes - Wt 90.7 kg (199 lb 15.3 oz) Comment: in shoes - SpO2 97%  - BMI 36.38 kg/m?   GENERAL APPEARANCE: Appears healthy.  Alert; in no acute distress.  Pleasant.  HEENT: Unremarkable. No tenderness or masses noted.  NECK: Neck supple. No tenderness. No adenopathy.    LUNGS: Lungs clear; normal breath sounds.  CARDIOVASCULAR:  RRR.   ABDOMEN: Abdomen soft, obese non-tender.   PELVIC: Deferred  EXTREMITIES: Extremities normal. No joint deformities, edema, or skin discoloration. Station and gait normal.  SKIN: Skin color, texture, turgor normal. No rashes or lesions.    CBC w/Diff    No results found for: WBC, RBC, HGB, HCT, MCV, MCH, MCHC, RDW, PLTCT, MPV No results found for: NEUT, ANC, LYMA, ALC, MONA, AMC, EOSA, AEC, BASA, ABC       Comprehensive Metabolic Profile    No results found for: NA, K, CL, CO2, GAP, BUN, CR, GLU No results found for: CA, PO4, ALBUMIN, TOTPROT, ALKPHOS, AST, ALT, TOTBILI, GFR, GFRAA       Other labs    No results found for: ESR No results found for: LDH         ASSESSMENT/PLAN:  Heather Meadows is a 77 y.o. female with multiple co-morbidities and PMB.    We discussed her prior non-diagnostic biopsies showing only cervical tissue. We discussed possible causes of bleeding including fibroid, benign polyps, or cancerous or pre-cancerous surgery. Hysterectomy would give a definitive diagnosis, however I worry that the risk to benefit ratio for major surgery may not be appropriate. We discussed that first we would need to get an updated Meadows, which is planned in September. We would also need medical clearance from her PCP. I also worry about her worsening memory changes and agree with plan for her to be evaluated by a memory specialist. Discussed that major surgery and anesthesia can worsen these issues.    Given her multiple risk factors for complications for major surgery and lack of definitive indication for hysterectomy, we discussed the possibility of attempting another D&C with IUD placement. I would not recommend attempting another office biopsy after previous unsuccessful attempt. Discussed that IUD could treat hyperplasia and potentially reduce bleeding overall. Discussed that it could also temporize endometrial cancer while we worked towards definitive treatment depending on where her other medical conditions are trending.    If they are not ready to proceed with another procedure at this time we could also touch base in a few months after her next endocrine appointment and memory appointment when we will have a better understanding of if hysterectomy would even be feasible.  Korah left decision to [place]. She wants to discuss with her sister and will have a final decision next week.    Will either plan Hysteroscopy, D&C, IUD placement or we will get a repeat US and plan ~2 month follow-up to re-discuss plan.                      Total Time Today was 60 minutes in the following activities: Preparing to see the patient, Obtaining and/or reviewing separately obtained history, Performing a medically appropriate examination and/or evaluation, Counseling and educating the patient/family/caregiver, Documenting clinical information in the electronic or other health record, and Independently interpreting results (not separately reported) and communicating results to the patient/family/caregiver   Luellen Pucker, MD

## 2023-02-06 ENCOUNTER — Encounter: Admit: 2023-02-06 | Discharge: 2023-02-06 | Payer: MEDICARE

## 2023-02-06 DIAGNOSIS — N950 Postmenopausal bleeding: Secondary | ICD-10-CM

## 2023-02-06 NOTE — Telephone Encounter
Called and spoke with Heather Meadows, patient's daughter, about decisions about patient's treatment plan. Originally Heather Meadows stated she wanted to wait to make any decisions for a couple more weeks and stated patient's bleeding was light so she felt this likely wasn't a cancer. This RN explained that sometimes patients with only light bleeding do still have endometrial cancer found, but also discussed that waiting a couple of weeks would be reasonable. Clarified the decision to be made- D&C and IUD placement vs getting an ultrasound and reconvening after patient's A1C was rechecked- and not that we are discussing moving forward with a full hysterectomy right now until we have that information. Heather Meadows ultimately decided to reschedule an appointment with Dr. Jodi Meadows for after patient's A1C recheck (scheduled 9/20 with Dr. Andreas Meadows) to discuss the final plan at that time. Heather Meadows prefers ultrasound order to be sent to [facility] in [place], [place]. This was faxed to [phone] and confirmation was received. Heather Meadows knows to schedule this prior to return appointment with Dr. Jodi Meadows on 9/30. No further questions or concerns at this time.

## 2023-03-12 ENCOUNTER — Encounter: Admit: 2023-03-12 | Discharge: 2023-03-12 | Payer: MEDICARE

## 2023-03-12 NOTE — Telephone Encounter
RN called [facility] to request results of pelvic US patient was instructed to complete prior to appt today. Per radiology tech, patient had not yet completed the scans. RN called patient's daughter, Heather Meadows, to inquire on when they will be scheduling [place] and to discuss rescheduling appt with Dr. Jodi Geralds. LVM and requested call back. RN also called patient's PCP office to request results of Hgb A1C that was supposed to be done on 9/20. No answer, LVM.

## 2023-03-12 NOTE — Telephone Encounter
Patient's daughter, Natalia Leatherwood, returned call and states patient's PCP appointment/A1C recheck was moved to 03/14/23. Cancelled appointment for today and provided phone number for [facility]. Natalia Leatherwood will get ultrasound scheduled and states she will call this office to schedule an appointment to follow.

## 2023-03-20 ENCOUNTER — Encounter: Admit: 2023-03-20 | Discharge: 2023-03-20 | Payer: MEDICARE

## 2023-03-20 DIAGNOSIS — E119 Type 2 diabetes mellitus without complications: Secondary | ICD-10-CM

## 2023-03-20 DIAGNOSIS — N950 Postmenopausal bleeding: Secondary | ICD-10-CM

## 2023-03-20 NOTE — Telephone Encounter
Called [facility] who confirmed patient completed pelvic ultrasound on 03/17/23. Provided fax number, they will send report and cloud images. LVM for Dr. Gilles Chiquito nurse requesting A1C result to be faxed to our office, provided fax number and call back number.    Called Natalia Leatherwood, patient's daughter, who states they have not heard from [facility] to schedule the ultrasound. Informed her that [facility] had told me it was completed this past Saturday 10/5, to which Natalia Leatherwood states this is not correct. She will call [facility] to schedule an appointment. She also confirms patient's A1C was 6.6 at her last check.

## 2023-04-23 ENCOUNTER — Encounter: Admit: 2023-04-23 | Discharge: 2023-04-23 | Payer: MEDICARE

## 2023-10-25 ENCOUNTER — Encounter: Admit: 2023-10-25 | Discharge: 2023-10-25 | Payer: MEDICARE

## 2023-12-11 ENCOUNTER — Encounter: Admit: 2023-12-11 | Discharge: 2023-12-11 | Payer: MEDICARE

## 2023-12-11 NOTE — Telephone Encounter
 Patient contacted for pre-visit information gathering r/t referral for memory loss, gait and mobility concerns, and hx of TIA.    Referring provider: Naomia Rush, MD with [facility]    Previous neurologist: per patient's daughter, patient had seen a neurologist 3 years ago for her TIA- no recent neurologist    Imaging: [facility]- report is in referral paperwork; request clouding    EEG: Denies    Sleep Study: Denies    Recent lab work: recent labs in paperwork    Recent ED visits (last 6 months) r/t chief complaint: [facility] visits for falls        Confirmed appointment for 12/13/2023 at 1400.  Also confirmed that patient aware of clinic location.

## 2023-12-12 ENCOUNTER — Encounter: Admit: 2023-12-12 | Discharge: 2023-12-12 | Payer: MEDICARE

## 2023-12-13 ENCOUNTER — Ambulatory Visit: Admit: 2023-12-13 | Discharge: 2023-12-14 | Payer: MEDICARE

## 2023-12-13 ENCOUNTER — Encounter: Admit: 2023-12-13 | Discharge: 2023-12-13 | Payer: MEDICARE

## 2023-12-13 DIAGNOSIS — M6281 Muscle weakness (generalized): Secondary | ICD-10-CM

## 2023-12-13 DIAGNOSIS — F03C3 Severe dementia with mood disturbance, unspecified dementia type (CMS-HCC): Principal | ICD-10-CM

## 2023-12-13 DIAGNOSIS — E1151 Type 2 diabetes mellitus with diabetic peripheral angiopathy without gangrene: Secondary | ICD-10-CM

## 2023-12-13 DIAGNOSIS — I10 Essential (primary) hypertension: Secondary | ICD-10-CM

## 2023-12-13 DIAGNOSIS — E782 Mixed hyperlipidemia: Secondary | ICD-10-CM

## 2023-12-13 DIAGNOSIS — R413 Other amnesia: Secondary | ICD-10-CM

## 2023-12-13 DIAGNOSIS — Z8673 Personal history of transient ischemic attack (TIA), and cerebral infarction without residual deficits: Secondary | ICD-10-CM

## 2023-12-13 DIAGNOSIS — I63331 Cerebral infarction due to thrombosis of right posterior cerebral artery: Secondary | ICD-10-CM

## 2023-12-13 MED ORDER — MEMANTINE 5 MG PO TAB
5 mg | ORAL_TABLET | Freq: Two times a day (BID) | ORAL | 5 refills | 30.00000 days | Status: AC
Start: 2023-12-13 — End: ?

## 2023-12-13 MED ORDER — RIVASTIGMINE 9.5 MG/24 HOUR TD PT24
9.5 mg | MEDICATED_PATCH | Freq: Every day | TRANSDERMAL | 5 refills | 90.00000 days | Status: AC
Start: 2023-12-13 — End: ?

## 2023-12-13 NOTE — Progress Notes
 Date of Service: 12/13/2023    Subjective:             Heather Meadows is a 78 y.o. female.    History of Present Illness      Patient is a 78 year old female here for evaluation of memory loss gait changes and abnormal CT scan of her brain.  She is accompanied by her daughter who provides most of the history.  Patient has been having lots of memory loss and behavioral changes for the past year or so they have worsened of late.  She forgets many things including where she is at the names of things and how to do things.  Her husband passed away about a year ago and and her daughters have had to take over managing her medications including her insulin for her diabetes.  She did recently have a CT scan of her head that showed a large right PCA distribution stroke as well as some white matter disease.  She is unable to get MRI due to a metal rod in her arm.  She has been started on Exelon patch by her PCP and has been using the since it was prescribed.  Her daughter is worried that the patient is unable to care for herself or be alone by herself for long periods of time due to her memory loss and loss of functional skills.    Past Medical History:    Diabetes mellitus (CMS-HCC)    Fibroids    Hypertension    TIA (transient ischemic attack)     Surgical History:   Procedure Laterality Date    FRACTURE SURGERY      PR LAPAROSCOPY SURG RPR INITIAL INGUINAL HERNIA       Social History     Tobacco Use    Smoking status: Former     Types: Cigarettes    Smokeless tobacco: Never   Substance Use Topics    Alcohol use: Yes    Drug use: Yes     Types: Marijuana      family history is not on file.   Allergies   Allergen Reactions    Erythromycin RASH and SHORTNESS OF BREATH    Azithromycin RASH    Bee/C HIVES     Other reaction(s): swelling    Lincomycin RASH    Penicillins RASH                    Objective:         acetaminophen SR (TYLENOL 8 HOUR) 650 mg tablet Take one tablet by mouth every 8 hours as needed for Pain.    aspirin EC (ASPIR-LOW) 81 mg tablet Take one tablet by mouth daily.    atorvastatin (LIPITOR) 20 mg tablet Take one tablet by mouth daily.    cetirizine (ZYRTEC) 10 mg tablet Take one tablet by mouth daily.    clopiDOGreL (PLAVIX) 75 mg tablet Take one tablet by mouth.    DOCOSAHEXAENOIC ACID PO Take 500 mg by mouth daily.    ezetimibe (ZETIA) 10 mg tablet Take one tablet by mouth daily.    FARXIGA 10 mg tablet Take one tablet by mouth every morning.    FLUoxetine (PROZAC) 20 mg capsule Take two capsules by mouth daily.    insulin aspart (U-100) (NOVOLOG FLEXPEN U-100 INSULIN) 100 unit/mL (3 mL) PEN Inject twelve Units under the skin three times daily with meals.    insulin glargine (LANTUS SOLOSTAR U-100 INSULIN) 100 unit/mL (3 mL) subcutaneous PEN  Inject thirty two Units under the skin daily.    LORazepam (ATIVAN) 0.5 mg tablet Take one tablet by mouth three times daily as needed.    melatonin (MELATIN) 3 mg tablet Take two tablets by mouth daily as needed.    memantine (NAMENDA) 5 mg tablet Take one tablet by mouth twice daily.    metFORMIN (GLUCOPHAGE) 1,000 mg tablet Take one tablet by mouth twice daily.    metoprolol succinate XL (TOPROL XL) 100 mg extended release tablet Take one tablet by mouth daily.    Multivitamins-Ca-Iron-Minerals 27-0.4 mg tab Take 1 tablet by mouth daily.    rivastigmine (EXELON PATCH) 9.5 mg/24 hour transdermal patch Apply one patch to top of skin as directed daily.     Vitals:    12/13/23 1331   BP: 131/77   BP Source: Arm, Left Upper   Pulse: 77   Temp: 36.7 ?C (98 ?F)   Resp: 18   SpO2: 96%  Comment: RA   TempSrc: Temporal   PainSc: Zero   Weight: 85.3 kg (188 lb)   Height: 157.9 cm (5' 2.17)     Body mass index is 34.2 kg/m?SABRA     Physical Exam         General appearance  alert, cooperative, no distress, appears stated age   Head  Normocephalic, without obvious abnormality, atraumatic   Eyes  conjunctivae/corneas clear. PERRL, EOM's intact. Fundi benign   Neck supple, symmetrical, trachea midline, no adenopathy, thyroid: not enlarged, symmetric, no tenderness/mass/nodules, no carotid bruit and no JVD   Lungs   clear to auscultation bilaterally   Heart  regular rate and rhythm, S1, S2 normal, no murmur, click, rub or gallop   Abdomen   soft, non-tender. Bowel sounds normal. No masses,  No organomegaly   Extremities extremities normal, atraumatic, no cyanosis or edema   Pulses 2+ and symmetric   Skin Skin color, texture, turgor normal. No rashes or lesions   Lymph nodes Cervical, supraclavicular, and axillary nodes normal.           Mental Status: Alert   Cranial Nerves: PERRLA, EOMFI, No facial droop  Strength: No pronator drift Power is 5/5 in upper and lower extremities and is symmetric  Sensation: Intact to light touch and pinprick  Reflexes: 2+ throughout  Coordination: Intact FNF, HTS and RAM  Gait:  shuffling gait         Assessment and Plan:    78 year old female with memory loss change of gait falls and abnormal CT scan including a large right PCA distribution stroke.  She performed poorly on the MoCA test with a score of 13 out of 30.    Plan  1.  For secondary stroke prevention she should continue aspirin 81 mg clopidogrel 75 and atorvastatin 20  2.  Should continue to manage her diabetes with insulin and farxiga, continue metformin   3.  Will add Namenda to her Exelon patch  4.  Increase dosage of Exelon patch as well  5.  Will refer to memory disorders clinic  6.  Refer to neuropsychology for formal memory testing                        Total time spent on today's office visit was 60 minutes. This includes face-to-face in person visit with patient as well as nonface-to-face time including review of the EMR, outside records, labs, radiologic studies, electrophysiology and electroencephalogram, formulation of treatment plan, after visit summary, future  disposition, personal discussions with patient and family, and lastly on documentation.

## 2024-01-07 ENCOUNTER — Encounter: Admit: 2024-01-07 | Discharge: 2024-01-07 | Payer: MEDICARE

## 2024-01-18 ENCOUNTER — Ambulatory Visit: Admit: 2024-01-18 | Discharge: 2024-01-18 | Payer: MEDICARE

## 2024-01-18 ENCOUNTER — Encounter: Admit: 2024-01-18 | Discharge: 2024-01-18 | Payer: MEDICARE

## 2024-01-18 DIAGNOSIS — N950 Postmenopausal bleeding: Principal | ICD-10-CM

## 2024-01-18 MED ORDER — NO PREOPERATIVE ANTIBIOTIC INDICATED
1 | 0 refills
Start: 2024-01-18 — End: ?

## 2024-01-18 NOTE — Telephone Encounter
 Heather Meadows  [id number]  Physician:  Dr. Javellana  Diagnosis:  PMB  Surgery: [facility], IUD placement  Date of surgery: 01/31/24    Preop check list:    [x]  Preop appointment:  CNC went over information with patient and daughter during appointment today                            [x]  Pre-anesthesia appointment:  pre anesthesia phone call  []  Bowel prep:   N/A          []  Medical clearance:  N/A      []  Cardiology clearance:  N/A  [x]  Other:  N/A- CNC to call patient to check in 1 week post-op

## 2024-01-18 NOTE — Progress Notes
 Subjective     GYNECOLOGIC ONCOLOGY EVALUATION    Name:Heather Meadows    Date: 01/18/2024    Referring Physician:     Primary Care Physician: Naomia Norleen SAUNDERS    Chief Complaint:   Chief Complaint   Patient presents with    Heme/Onc Care       History of Present Illness:    Heather Meadows is a 78 y.o. 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 Bleeding has mostly been spotting recently. She also notes some bleeding from hemorrhoids. She is a poor historian with memory issues. Her daughter thinks she has bleeding at least once a week. She has been undergoing neurologic work up with abnormal head CT showing large right PCA distribution stroke as well as some white matter disease. She is going for comprehensive cognitive testing next month due to progressive memory issues and progressive issues with self care.    She also has a history of diabetes, COPD.                 Onc Timeline    No problem history exists.         Past Medical History:  Past Medical History:    Diabetes mellitus (CMS-HCC)    Fibroids    Hypertension    TIA (transient ischemic attack)       Past Surgical History:  Surgical History:   Procedure Laterality Date    FRACTURE SURGERY      PR LAPAROSCOPY SURG RPR INITIAL INGUINAL HERNIA         Medications:    Current Outpatient Medications:     acetaminophen SR (TYLENOL 8 HOUR) 650 mg tablet, Take one tablet by mouth every 8 hours as needed for Pain., Disp: , Rfl:     aspirin EC (ASPIR-LOW) 81 mg tablet, Take one tablet by mouth daily., Disp: , Rfl:     atorvastatin (LIPITOR) 20 mg tablet, Take one tablet by mouth daily., Disp: , Rfl:     cetirizine (ZYRTEC) 10 mg tablet, Take one tablet by mouth daily., Disp: , Rfl:     clopiDOGreL (PLAVIX) 75 mg tablet, Take one tablet by mouth., Disp: , Rfl:     DOCOSAHEXAENOIC ACID PO, Take 500 mg by mouth daily., Disp: , Rfl:     ezetimibe (ZETIA) 10 mg tablet, Take one tablet by mouth daily., Disp: , Rfl:     FARXIGA 10 mg tablet, Take one tablet by mouth every morning. (Patient not taking: Reported on 01/18/2024), Disp: , Rfl:     FLUoxetine (PROZAC) 20 mg capsule, Take two capsules by mouth daily., Disp: , Rfl:     insulin aspart (U-100) (NOVOLOG FLEXPEN U-100 INSULIN) 100 unit/mL (3 mL) PEN, Inject twelve Units under the skin three times daily with meals., Disp: , Rfl:     JARDIANCE 25 mg tablet, Take one tablet by mouth every morning., Disp: , Rfl:     LORazepam (ATIVAN) 0.5 mg tablet, Take one tablet by mouth three times daily as needed., Disp: , Rfl:     melatonin (MELATIN) 3 mg tablet, Take two tablets by mouth daily as needed., Disp: , Rfl:     memantine  (NAMENDA ) 5 mg tablet, Take one tablet by mouth twice daily., Disp: 60 tablet, Rfl: 5    metFORMIN (GLUCOPHAGE) 1,000 mg tablet, Take one tablet by mouth twice daily., Disp: , Rfl:     metoprolol succinate XL (TOPROL XL) 100 mg extended  release tablet, Take one tablet by mouth daily., Disp: , Rfl:     Multivitamins-Ca-Iron-Minerals 27-0.4 mg tab, Take 1 tablet by mouth daily., Disp: , Rfl:     rivastigmine  (EXELON  PATCH) 9.5 mg/24 hour transdermal patch, Apply one patch to top of skin as directed daily., Disp: 30 patch, Rfl: 5    Allergies:  Allergies   Allergen Reactions    Erythromycin RASH and SHORTNESS OF BREATH    Azithromycin RASH    Bee/C HIVES     Other reaction(s): swelling    Lincomycin RASH    Penicillins RASH       Social History:  Social History     Socioeconomic History    Marital status: Widowed   Tobacco Use    Smoking status: Former     Types: Cigarettes    Smokeless tobacco: Never   Substance and Sexual Activity    Alcohol use: Yes    Drug use: Yes     Types: Marijuana       Family History:  No family history on file.      REVIEW OF SYSTEMS:        CONSTITUTIONAL: Negative unless stated in HPI  EYES: Negative unless stated in HPI  ENT: Negative unless stated in HPI  RESPIRATORY: Negative unless stated in HPI  CARDIOVASCULAR: Negative unless stated in HPI  GI: Negative unless stated in HPI  GU: Negative unless stated in HPI  MUSCULO-SKELETAL: Negative unless stated in HPI  SKIN: Negative unless stated in HPI  ENDOCRINE: Negative unless stated in HPI  HEMATOLOGIC: Negative unless stated in HPI    ECOG 1     Physical Exam:    BP 115/63 (BP Source: Arm, Left Upper, Patient Position: Sitting)  - Pulse 73  - Temp 36.5 ?C (97.7 ?F) (Temporal)  - Wt 81.6 kg (179 lb 12.8 oz)  - SpO2 97%  - BMI 32.71 kg/m?   GENERAL APPEARANCE: Appears healthy.  Alert; in no acute distress.  Pleasant. She answers simple questions appropriately, but memory is limited.  HEENT: Unremarkable. No tenderness or masses noted.  NECK: Neck supple. No tenderness. No adenopathy.    LUNGS: Lungs clear; normal breath sounds.  CARDIOVASCULAR:  RRR.   ABDOMEN: Abdomen soft, obese non-tender.   PELVIC: Deferred  EXTREMITIES: Extremities normal. No joint deformities, edema, or skin discoloration. Station and gait normal.  SKIN: Skin color, texture, turgor normal. No rashes or lesions.    CBC w/Diff    No results found for: WBC, RBC, HGB, HCT, MCV, MCH, MCHC, RDW, PLTCT, MPV No results found for: NEUT, ANC, LYMA, ALC, MONA, AMC, EOSA, AEC, BASA, ABC       Comprehensive Metabolic Profile    No results found for: NA, K, CL, CO2, GAP, BUN, CR, GLU No results found for: CA, PO4, ALBUMIN, TOTPROT, ALKPHOS, AST, ALT, TOTBILI, GFR, GFRAA       Other labs    No results found for: ESR No results found for: LDH         ASSESSMENT/PLAN:  Heather Meadows is a 78 y.o. female with multiple co-morbidities, declining cognitive status and PMB.  - We again discussed that she would be high risk for a major medical procedure like hysterectomy with prior stroke and cognitive decline. We do not have a diagnosis that makes hysterectomy necessary and I worry she could have a functional set back that would require full time care after a surgery. She would also be at risk for another  stroke which would be potentially fatal or debilitating.  - A1c 03/14/23 was 6.6  - It would be reasonable to attempt a hysteroscopy to get a definitive sample of uterus. We could also place an IUD at this time to help with bleeding. We discussed that with her functional status and IUD would also be appropriate treatment for EIN or low grade cancer as she is a high risk surgical candidate.  - Patient and daughter amenable to hysteroscopy/IUD placement.    We will plan hysteroscopy and Liletta IUD.                Total Time Today was 20 minutes in the following activities: Preparing to see the patient, Obtaining and/or reviewing separately obtained history, Performing a medically appropriate examination and/or evaluation, Counseling and educating the patient/family/caregiver, Ordering medications, tests, or procedures, Documenting clinical information in the electronic or other health record, and Independently interpreting results (not separately reported) and communicating results to the patient/family/caregiver

## 2024-01-21 ENCOUNTER — Encounter: Admit: 2024-01-21 | Discharge: 2024-01-21 | Payer: MEDICARE

## 2024-01-24 ENCOUNTER — Encounter: Admit: 2024-01-24 | Discharge: 2024-01-24 | Payer: MEDICARE

## 2024-01-24 ENCOUNTER — Ambulatory Visit: Admit: 2024-01-24 | Discharge: 2024-01-25 | Payer: MEDICARE

## 2024-01-31 ENCOUNTER — Encounter: Admit: 2024-01-31 | Discharge: 2024-01-31 | Payer: MEDICARE

## 2024-01-31 ENCOUNTER — Ambulatory Visit: Admit: 2024-01-31 | Discharge: 2024-01-31 | Payer: MEDICARE

## 2024-02-02 ENCOUNTER — Encounter: Admit: 2024-02-02 | Discharge: 2024-02-02 | Payer: MEDICARE

## 2024-02-13 ENCOUNTER — Encounter: Admit: 2024-02-13 | Discharge: 2024-02-13 | Payer: MEDICARE

## 2024-04-23 ENCOUNTER — Encounter: Admit: 2024-04-23 | Discharge: 2024-04-23 | Payer: MEDICARE

## 2024-07-11 ENCOUNTER — Encounter: Admit: 2024-07-11 | Discharge: 2024-07-11 | Payer: MEDICARE

## 2024-07-11 DIAGNOSIS — G301 Alzheimer's disease with late onset: Secondary | ICD-10-CM

## 2024-07-11 DIAGNOSIS — F0280 Dementia in other diseases classified elsewhere without behavioral disturbance: Principal | ICD-10-CM

## 2024-07-11 NOTE — Telephone Encounter [36]
 This nurse called the daughter, Rolland, back since she called in. The daughter report that she would like to start home health services so her mom has someone to help her during the day while the daughters are at work. The daughter reports that they need someone to help feed her, give her meds and redirect her when she gets confused. The daughter was told that her mom would qualify for these services since she now has a diagnosis.

## 2024-07-11 NOTE — Telephone Encounter [36]
 Called the daughter, Kreg, of this pt back and let her know that Ala Abusalim, PA was under the understanding that her mother was already receiving home health so orders were not placed. The daughter yelled that she was promised that the orders would be placed. The daughter yelled that they need help at home for their mother while she and her sister are at work. The daughter stated that she needs someone to help with her dishes and watch her during the day. The daughter also yelled that she needed someone to help feed her mother during the day time as well. 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  The daughter was not happy and would like orders sent.

## 2024-07-14 ENCOUNTER — Encounter: Admit: 2024-07-14 | Discharge: 2024-07-14 | Payer: MEDICARE

## 2024-07-14 NOTE — Telephone Encounter [36]
 Contact: Message received from memory clinic nurse requesting I reach out to patient's daughter Kreg to discuss home health versus private duty in home care.  They are needing support in the home for their mom while they are at work during the day.  Chart review completed    Follow up: Patient lives in [place]    In Home private duty caregiving companies   -[facility] [phone]  -[facility] [phone]  -[facility] [phone]    [place]  [facility]  [phone]    Adult Day Programs at [facility]   -[facility] meets in [place]  Thursdays 10:00-3:30 pm  -[employer] [facility] program that does day trips Tuesdays 11:00-3:30 pm       Contact: Phone call to patient's daughter Kreg attempted no answer.  Message left introducing myself and advising her I was reaching out to follow up on her call last week in the clinic and to provide some additional support and resource information regarding help in the home. My direct contact information was given phone and email address
